# Patient Record
Sex: Female | Born: 1950 | Race: White | Hispanic: No | Marital: Married | Smoking: Never smoker
Health system: Southern US, Community
[De-identification: ages and names within clinical notes are randomized; demographics above are authoritative.]

## PROBLEM LIST (undated history)

## (undated) DIAGNOSIS — N3020 Other chronic cystitis without hematuria: Secondary | ICD-10-CM

## (undated) DIAGNOSIS — K219 Gastro-esophageal reflux disease without esophagitis: Secondary | ICD-10-CM

## (undated) DIAGNOSIS — M5430 Sciatica, unspecified side: Secondary | ICD-10-CM

## (undated) DIAGNOSIS — I340 Nonrheumatic mitral (valve) insufficiency: Secondary | ICD-10-CM

## (undated) DIAGNOSIS — M810 Age-related osteoporosis without current pathological fracture: Secondary | ICD-10-CM

## (undated) DIAGNOSIS — R519 Headache, unspecified: Secondary | ICD-10-CM

## (undated) DIAGNOSIS — R51 Headache: Secondary | ICD-10-CM

## (undated) DIAGNOSIS — I059 Rheumatic mitral valve disease, unspecified: Secondary | ICD-10-CM

## (undated) DIAGNOSIS — G43909 Migraine, unspecified, not intractable, without status migrainosus: Secondary | ICD-10-CM

## (undated) DIAGNOSIS — R7303 Prediabetes: Secondary | ICD-10-CM

## (undated) DIAGNOSIS — E782 Mixed hyperlipidemia: Secondary | ICD-10-CM

## (undated) HISTORY — PX: OOPHORECTOMY: SHX86

## (undated) HISTORY — PX: TONSILLECTOMY: SUR1361

## (undated) HISTORY — PX: COLONOSCOPY W/ POLYPECTOMY: SHX1380

## (undated) HISTORY — PX: ABDOMINAL HYSTERECTOMY: SHX81

## (undated) HISTORY — PX: INCONTINENCE SURGERY: SHX676

## (undated) HISTORY — PX: FL INJ RT SHOULDER CT ARTHROGRAM (ARMC HX): HXRAD1304

---

## 2004-08-21 HISTORY — PX: BREAST EXCISIONAL BIOPSY: SUR124

## 2005-12-07 ENCOUNTER — Ambulatory Visit: Payer: Self-pay | Admitting: Surgery

## 2006-04-01 ENCOUNTER — Ambulatory Visit: Payer: Self-pay | Admitting: Gastroenterology

## 2006-09-20 ENCOUNTER — Ambulatory Visit: Payer: Self-pay | Admitting: Urology

## 2006-10-01 ENCOUNTER — Ambulatory Visit: Payer: Self-pay | Admitting: Urology

## 2007-08-29 ENCOUNTER — Ambulatory Visit: Payer: Self-pay | Admitting: Gastroenterology

## 2008-01-14 ENCOUNTER — Ambulatory Visit: Payer: Self-pay | Admitting: Family Medicine

## 2010-09-04 ENCOUNTER — Ambulatory Visit: Payer: Self-pay | Admitting: Orthopedic Surgery

## 2010-10-13 ENCOUNTER — Other Ambulatory Visit: Payer: Self-pay | Admitting: Anesthesiology

## 2010-10-20 ENCOUNTER — Ambulatory Visit: Payer: Self-pay | Admitting: Orthopedic Surgery

## 2011-01-20 ENCOUNTER — Ambulatory Visit: Payer: Self-pay | Admitting: Internal Medicine

## 2011-05-28 ENCOUNTER — Ambulatory Visit: Payer: Self-pay

## 2013-05-30 ENCOUNTER — Ambulatory Visit: Payer: Self-pay

## 2014-04-05 ENCOUNTER — Ambulatory Visit: Payer: Self-pay | Admitting: Neurology

## 2014-06-12 ENCOUNTER — Ambulatory Visit: Payer: Self-pay

## 2015-02-05 ENCOUNTER — Emergency Department: Payer: Self-pay | Admitting: Emergency Medicine

## 2015-05-07 ENCOUNTER — Other Ambulatory Visit: Payer: Self-pay | Admitting: Physician Assistant

## 2015-05-07 DIAGNOSIS — Z1231 Encounter for screening mammogram for malignant neoplasm of breast: Secondary | ICD-10-CM

## 2015-05-21 ENCOUNTER — Other Ambulatory Visit: Payer: Self-pay | Admitting: Neurology

## 2015-05-21 DIAGNOSIS — R519 Headache, unspecified: Secondary | ICD-10-CM

## 2015-05-21 DIAGNOSIS — R55 Syncope and collapse: Secondary | ICD-10-CM

## 2015-05-21 DIAGNOSIS — R51 Headache: Principal | ICD-10-CM

## 2015-05-27 ENCOUNTER — Ambulatory Visit
Admission: RE | Admit: 2015-05-27 | Discharge: 2015-05-27 | Disposition: A | Discharge status: Home / Self Care | Payer: BLUE CROSS/BLUE SHIELD | Source: Ambulatory Visit | Attending: Neurology | Admitting: Neurology

## 2015-05-27 DIAGNOSIS — R51 Headache: Secondary | ICD-10-CM | POA: Insufficient documentation

## 2015-05-27 DIAGNOSIS — R55 Syncope and collapse: Secondary | ICD-10-CM | POA: Insufficient documentation

## 2015-05-27 DIAGNOSIS — G8929 Other chronic pain: Secondary | ICD-10-CM

## 2015-05-27 MED ORDER — GADOBENATE DIMEGLUMINE 529 MG/ML IV SOLN
15.0000 mL | Freq: Once | INTRAVENOUS | Status: AC | PRN
  Administered 2015-05-27: 14 mL via INTRAVENOUS

## 2015-06-18 ENCOUNTER — Ambulatory Visit: Payer: Self-pay

## 2015-06-19 ENCOUNTER — Ambulatory Visit
Admission: RE | Admit: 2015-06-19 | Discharge: 2015-06-19 | Disposition: A | Discharge status: Home / Self Care | Payer: BLUE CROSS/BLUE SHIELD | Source: Ambulatory Visit | Attending: Physician Assistant | Admitting: Physician Assistant

## 2015-06-19 DIAGNOSIS — Z1231 Encounter for screening mammogram for malignant neoplasm of breast: Secondary | ICD-10-CM | POA: Insufficient documentation

## 2016-08-13 ENCOUNTER — Other Ambulatory Visit: Payer: Self-pay | Admitting: Physician Assistant

## 2016-08-13 DIAGNOSIS — Z1231 Encounter for screening mammogram for malignant neoplasm of breast: Secondary | ICD-10-CM

## 2016-09-08 ENCOUNTER — Ambulatory Visit: Payer: BLUE CROSS/BLUE SHIELD

## 2016-09-24 ENCOUNTER — Ambulatory Visit
Admission: RE | Admit: 2016-09-24 | Discharge: 2016-09-24 | Disposition: A | Discharge status: Home / Self Care | Payer: BLUE CROSS/BLUE SHIELD | Source: Ambulatory Visit | Attending: Physician Assistant | Admitting: Physician Assistant

## 2016-09-24 DIAGNOSIS — Z1231 Encounter for screening mammogram for malignant neoplasm of breast: Secondary | ICD-10-CM

## 2016-10-08 ENCOUNTER — Other Ambulatory Visit: Payer: Self-pay | Admitting: Gastroenterology

## 2016-10-08 DIAGNOSIS — R1310 Dysphagia, unspecified: Secondary | ICD-10-CM

## 2016-10-08 DIAGNOSIS — K219 Gastro-esophageal reflux disease without esophagitis: Secondary | ICD-10-CM

## 2016-11-09 ENCOUNTER — Ambulatory Visit: Payer: BLUE CROSS/BLUE SHIELD | Attending: Gastroenterology

## 2017-01-20 ENCOUNTER — Ambulatory Visit
Admission: RE | Admit: 2017-01-20 | Discharge: 2017-01-20 | Disposition: A | Discharge status: Home / Self Care | Payer: BLUE CROSS/BLUE SHIELD | Source: Ambulatory Visit | Attending: Gastroenterology | Admitting: Gastroenterology

## 2017-01-20 DIAGNOSIS — K449 Diaphragmatic hernia without obstruction or gangrene: Secondary | ICD-10-CM | POA: Insufficient documentation

## 2017-01-20 DIAGNOSIS — K228 Other specified diseases of esophagus: Secondary | ICD-10-CM | POA: Diagnosis not present

## 2017-01-20 DIAGNOSIS — K219 Gastro-esophageal reflux disease without esophagitis: Secondary | ICD-10-CM

## 2017-01-20 DIAGNOSIS — R1310 Dysphagia, unspecified: Secondary | ICD-10-CM

## 2017-01-25 ENCOUNTER — Encounter: Payer: Self-pay | Admitting: *Deleted

## 2017-01-26 ENCOUNTER — Ambulatory Visit: Payer: BLUE CROSS/BLUE SHIELD | Admitting: Anesthesiology

## 2017-01-26 ENCOUNTER — Ambulatory Visit
Admission: RE | Admit: 2017-01-26 | Discharge: 2017-01-26 | Disposition: A | Discharge status: Home / Self Care | Payer: BLUE CROSS/BLUE SHIELD | Source: Ambulatory Visit | Attending: Gastroenterology | Admitting: Gastroenterology

## 2017-01-26 ENCOUNTER — Encounter
Admission: RE | Disposition: A | Discharge status: Home / Self Care | Payer: Self-pay | Source: Ambulatory Visit | Attending: Gastroenterology

## 2017-01-26 DIAGNOSIS — K210 Gastro-esophageal reflux disease with esophagitis: Secondary | ICD-10-CM | POA: Insufficient documentation

## 2017-01-26 DIAGNOSIS — K228 Other specified diseases of esophagus: Secondary | ICD-10-CM | POA: Diagnosis not present

## 2017-01-26 DIAGNOSIS — Z79899 Other long term (current) drug therapy: Secondary | ICD-10-CM | POA: Insufficient documentation

## 2017-01-26 DIAGNOSIS — R51 Headache: Secondary | ICD-10-CM | POA: Diagnosis not present

## 2017-01-26 DIAGNOSIS — K224 Dyskinesia of esophagus: Secondary | ICD-10-CM | POA: Diagnosis not present

## 2017-01-26 DIAGNOSIS — B3781 Candidal esophagitis: Secondary | ICD-10-CM | POA: Insufficient documentation

## 2017-01-26 DIAGNOSIS — Z7983 Long term (current) use of bisphosphonates: Secondary | ICD-10-CM | POA: Insufficient documentation

## 2017-01-26 DIAGNOSIS — Z79890 Hormone replacement therapy (postmenopausal): Secondary | ICD-10-CM | POA: Diagnosis not present

## 2017-01-26 DIAGNOSIS — R1310 Dysphagia, unspecified: Secondary | ICD-10-CM | POA: Diagnosis not present

## 2017-01-26 DIAGNOSIS — Z792 Long term (current) use of antibiotics: Secondary | ICD-10-CM | POA: Insufficient documentation

## 2017-01-26 DIAGNOSIS — Z881 Allergy status to other antibiotic agents status: Secondary | ICD-10-CM | POA: Diagnosis not present

## 2017-01-26 DIAGNOSIS — Z791 Long term (current) use of non-steroidal anti-inflammatories (NSAID): Secondary | ICD-10-CM | POA: Diagnosis not present

## 2017-01-26 DIAGNOSIS — Q399 Congenital malformation of esophagus, unspecified: Secondary | ICD-10-CM | POA: Diagnosis not present

## 2017-01-26 DIAGNOSIS — K449 Diaphragmatic hernia without obstruction or gangrene: Secondary | ICD-10-CM | POA: Insufficient documentation

## 2017-01-26 DIAGNOSIS — Z888 Allergy status to other drugs, medicaments and biological substances status: Secondary | ICD-10-CM | POA: Diagnosis not present

## 2017-01-26 DIAGNOSIS — Z882 Allergy status to sulfonamides status: Secondary | ICD-10-CM | POA: Insufficient documentation

## 2017-01-26 HISTORY — DX: Rheumatic mitral valve disease, unspecified: I05.9

## 2017-01-26 HISTORY — DX: Other chronic cystitis without hematuria: N30.20

## 2017-01-26 HISTORY — PX: ESOPHAGOGASTRODUODENOSCOPY (EGD) WITH PROPOFOL: SHX5813

## 2017-01-26 HISTORY — DX: Gastro-esophageal reflux disease without esophagitis: K21.9

## 2017-01-26 HISTORY — DX: Headache, unspecified: R51.9

## 2017-01-26 HISTORY — DX: Age-related osteoporosis without current pathological fracture: M81.0

## 2017-01-26 HISTORY — DX: Headache: R51

## 2017-01-26 SURGERY — ESOPHAGOGASTRODUODENOSCOPY (EGD) WITH PROPOFOL
Anesthesia: General

## 2017-01-26 MED ORDER — PHENYLEPHRINE HCL 10 MG/ML IJ SOLN
INTRAMUSCULAR | Status: DC | PRN
  Administered 2017-01-26 (×2): 100 ug via INTRAVENOUS

## 2017-01-26 MED ORDER — SODIUM CHLORIDE 0.9 % IV SOLN: INTRAVENOUS | Status: DC

## 2017-01-26 MED ORDER — PROPOFOL 500 MG/50ML IV EMUL
INTRAVENOUS | Status: AC
  Filled 2017-01-26: qty 50

## 2017-01-26 MED ORDER — FENTANYL CITRATE (PF) 100 MCG/2ML IJ SOLN
INTRAMUSCULAR | Status: AC
  Filled 2017-01-26: qty 2

## 2017-01-26 MED ORDER — PROPOFOL 500 MG/50ML IV EMUL
INTRAVENOUS | Status: DC | PRN
  Administered 2017-01-26: 140 ug/kg/min via INTRAVENOUS

## 2017-01-26 MED ORDER — FENTANYL CITRATE (PF) 100 MCG/2ML IJ SOLN
INTRAMUSCULAR | Status: DC | PRN
  Administered 2017-01-26: 50 ug via INTRAVENOUS

## 2017-01-26 MED ORDER — PROPOFOL 10 MG/ML IV BOLUS
INTRAVENOUS | Status: DC | PRN
  Administered 2017-01-26: 100 mg via INTRAVENOUS

## 2017-01-26 MED ORDER — LIDOCAINE 2% (20 MG/ML) 5 ML SYRINGE
INTRAMUSCULAR | Status: DC | PRN
  Administered 2017-01-26: 40 mg via INTRAVENOUS

## 2017-01-26 MED ORDER — SODIUM CHLORIDE 0.9 % IV SOLN
INTRAVENOUS | Status: DC
  Administered 2017-01-26: 11:00:00 via INTRAVENOUS

## 2017-01-26 NOTE — Anesthesia Preprocedure Evaluation (Signed)
Anesthesia Evaluation  Patient identified by MRN, date of birth, ID band Patient awake    Reviewed: Allergy & Precautions, H&P , NPO status , Patient's Chart, lab work & pertinent test results, reviewed documented beta blocker date and time   Airway Mallampati: II   Neck ROM: full    Dental  (+) Teeth Intact   Pulmonary neg pulmonary ROS,    Pulmonary exam normal        Cardiovascular negative cardio ROS Normal cardiovascular exam Rhythm:regular Rate:Normal     Neuro/Psych  Headaches, negative neurological ROS  negative psych ROS   GI/Hepatic negative GI ROS, Neg liver ROS, GERD  Medicated,  Endo/Other  negative endocrine ROS  Renal/GU negative Renal ROS  negative genitourinary   Musculoskeletal   Abdominal   Peds  Hematology negative hematology ROS (+)   Anesthesia Other Findings Past Medical History: No date: Bladder infection, chronic No date: GERD (gastroesophageal reflux disease) No date: Headache No date: Mitral valve disorder No date: Senile osteoporosis Past Surgical History: No date: ABDOMINAL HYSTERECTOMY 08/21/04: BREAST EXCISIONAL BIOPSY Left     Comment: neg No date: INCONTINENCE SURGERY BMI    Body Mass Index:  26.13 kg/m     Reproductive/Obstetrics negative OB ROS                             Anesthesia Physical Anesthesia Plan  ASA: II  Anesthesia Plan: General   Post-op Pain Management:    Induction:   Airway Management Planned:   Additional Equipment:   Intra-op Plan:   Post-operative Plan:   Informed Consent: I have reviewed the patients History and Physical, chart, labs and discussed the procedure including the risks, benefits and alternatives for the proposed anesthesia with the patient or authorized representative who has indicated his/her understanding and acceptance.   Dental Advisory Given  Plan Discussed with: CRNA  Anesthesia Plan  Comments:         Anesthesia Quick Evaluation

## 2017-01-26 NOTE — Anesthesia Post-op Follow-up Note (Cosign Needed)
Anesthesia QCDR form completed.        

## 2017-01-26 NOTE — Op Note (Signed)
[facility] Gastroenterology Patient Name: Brittney Campos Procedure Date: 01/26/2017 12:02 PM MRN: [id number] Account #: [id number] Date of Birth: [birth year] Admit Type: Outpatient Age: 66 Room: [facility] ENDO ROOM 3 Gender: Female Note Status: Finalized Procedure:            Upper GI endoscopy Indications:          Dysphagia, Gastro-esophageal reflux disease Providers:            Lollie Sails, MD Referring MD:         Precious Bard, MD (Referring MD) Medicines:            Monitored Anesthesia Care Complications:        No immediate complications. Procedure:            Pre-Anesthesia Assessment:                       - ASA Grade Assessment: II - A patient with mild                        systemic disease.                       After obtaining informed consent, the endoscope was                        passed under direct vision. Throughout the procedure,                        the patient's blood pressure, pulse, and oxygen                        saturations were monitored continuously. The Endoscope                        was introduced through the mouth, and advanced to the                        third part of duodenum. The upper GI endoscopy was                        accomplished without difficulty. The patient tolerated                        the procedure well. Findings:      The lower third of the esophagus was moderately tortuous.      Abnormal motility was noted in the middle third of the esophagus and in       the lower third of the esophagus. The cricopharyngeus was normal. There       is spasticity and extra peristaltic waves of the esophageal body. The       distal esophagus/lower esophageal sphincter is open. Tertiary       peristaltic waves are noted.      The Z-line was variable. Biopsies were taken with a cold forceps for       histology.      A small hiatal hernia was present. This gives a 'shelf-like" protrusion       into the lumen  with a sharp turn to enter the gastric vault. There is no       evidence of fixed stricture.  The entire examined stomach was normal.      The examined duodenum was normal.      Patchy candidiasis was found in the middle third of the esophagus and in       the lower third of the esophagus. Cells for cytology were obtained by       brushing. Impression:           - Tortuous esophagus.                       - Abnormal esophageal motility, consistent with                        presbyesophagus.                       - Z-line variable. Biopsied.                       - Small hiatal hernia.                       - Normal stomach.                       - Normal examined duodenum.                       - Monilial esophagitis. Cells for cytology obtained. Recommendation:       - Discharge patient to home.                       - Continue present medications.                       - consider Mycelex (clotrimazole) 10 mg lozenge 5x/day                        for 5 days after cytology results. . Procedure Code(s):    --- Professional ---                       [phone], Esophagogastroduodenoscopy, flexible, transoral;                        with biopsy, single or multiple Diagnosis Code(s):    --- Professional ---                       Q39.9, Congenital malformation of esophagus, unspecified                       K22.4, Dyskinesia of esophagus                       K22.8, Other specified diseases of esophagus                       K44.9, Diaphragmatic hernia without obstruction or                        gangrene                       B37.81, Candidal esophagitis  R13.10, Dysphagia, unspecified                       K21.9, Gastro-esophageal reflux disease without                        esophagitis CPT copyright 2016 American Medical Association. All rights reserved. The codes documented in this report are preliminary and upon coder review may  be revised to meet current  compliance requirements. Lollie Sails, MD 01/26/2017 12:50:12 PM This report has been signed electronically. Number of Addenda: 0 Note Initiated On: 01/26/2017 12:02 PM      [facility]

## 2017-01-26 NOTE — Transfer of Care (Signed)
Immediate Anesthesia Transfer of Care Note  Patient: Brittney Campos  Procedure(s) Performed: Procedure(s): ESOPHAGOGASTRODUODENOSCOPY (EGD) WITH PROPOFOL (N/A)  Patient Location: PACU  Anesthesia Type:General  Level of Consciousness: awake, alert  and oriented  Airway & Oxygen Therapy: Patient Spontanous Breathing and Patient connected to nasal cannula oxygen  Post-op Assessment: Report given to RN and Post -op Vital signs reviewed and stable  Post vital signs: Reviewed and stable  Last Vitals:  Vitals:   01/26/17 1021 01/26/17 1255  BP: 122/67 (!) 96/47  Pulse: 76 68  Resp: 14 18  Temp: (!) 35.7 C (!) 36.1 C    Last Pain:  Vitals:   01/26/17 1255  TempSrc: Tympanic         Complications: No apparent anesthesia complications

## 2017-01-26 NOTE — H&P (Signed)
Outpatient short stay form Pre-procedure 01/26/2017 12:15 PM  Lollie Sails MD  Primary Physician: Krystal Clark PA  Reason for visit:  EGD  History of present illness:  Patient is a 66 year old female presenting today as above. She has a history of dysphagia in the past. She has a recent barium esophagram showing presbyesophagus. There was no fixed stricture nor was there any evidence of hang-up of the barium tablet. This promptly past in the mouth into the stomach. Patient does not regurgitate foods. She feels like things go down slowly at times. This includes both liquids and solids. He does take a proton pump inhibitor.    Current Facility-Administered Medications:  .  0.9 %  sodium chloride infusion, , Intravenous, Continuous, Lollie Sails, MD, Last Rate: 20 mL/hr at 01/26/17 1042 .  0.9 %  sodium chloride infusion, , Intravenous, Continuous, Lollie Sails, MD .  0.9 %  sodium chloride infusion, , Intravenous, Continuous, Lollie Sails, MD  Prescriptions Prior to Admission  Medication Sig Dispense Refill Last Dose  . ALPRAZolam (XANAX) 0.25 MG tablet Take 0.25 mg by mouth 3 (three) times daily as needed for anxiety.   Past Week at Unknown time  . calcium carbonate (OSCAL) 1500 (600 Ca) MG TABS tablet Take by mouth daily.   01/23/2017  . cyclobenzaprine (FLEXERIL) 5 MG tablet Take 5 mg by mouth 2 (two) times daily as needed for muscle spasms.   01/24/2017  . estradiol (ESTRACE) 0.1 MG/GM vaginal cream Place 1 Applicatorful vaginally once a week.   01/25/2017 at Unknown time  . ibandronate (BONIVA) 150 MG tablet Take 150 mg by mouth every 30 (thirty) days. Take in the morning with a full glass of water, on an empty stomach, and do not take anything else by mouth or lie down for the next 30 min.   Past Month at Unknown time  . Multiple Vitamin (MULTIVITAMIN) tablet Take 1 tablet by mouth daily.   01/23/2017  . pantoprazole (PROTONIX) 40 MG tablet Take 40 mg by mouth daily.    01/25/2017 at Unknown time  . promethazine (PHENERGAN) 25 MG tablet Take 25 mg by mouth daily as needed for nausea or vomiting.   01/17/2017  . ranitidine (ZANTAC) 300 MG capsule Take 300 mg by mouth every evening.   01/25/2017 at Unknown time  . SUMAtriptan (IMITREX) 100 MG tablet Take 100 mg by mouth every 2 (two) hours as needed for migraine. May repeat in 2 hours if headache persists or recurs.   01/24/2017  . topiramate (TOPAMAX) 100 MG tablet Take 100 mg by mouth daily.   01/25/2017 at Unknown time  . meloxicam (MOBIC) 7.5 MG tablet Take 7.5 mg by mouth daily.   Not Taking at Unknown time  . nitrofurantoin (MACRODANTIN) 50 MG capsule Take 50 mg by mouth daily.   Not Taking at Unknown time     Allergies  Allergen Reactions  . Biaxin [Clarithromycin]   . Fosamax [Alendronate Sodium]   . Macrobid [Nitrofurantoin Macrocrystal]   . Prednisone   . Sulfamethoxazole-Trimethoprim   . Venlafaxine      Past Medical History:  Diagnosis Date  . Bladder infection, chronic   . GERD (gastroesophageal reflux disease)   . Headache   . Mitral valve disorder   . Senile osteoporosis     Review of systems:      Physical Exam    Heart and lungs: Regular rate and rhythm without rub or gallop, lungs are bilaterally clear.  HEENT: Normal cephalic atraumatic eyes are anicteric    Other:     Pertinant exam for procedure: Soft nontender nondistended bowel sounds positive normoacti    Planned proceedures: EGD and indicated procedures. I have discussed the risks benefits and complications of procedures to include not limited to bleeding, infection, perforation and the risk of sedation and the patient wishes to proceed.    Lollie Sails, MD Gastroenterology 01/26/2017  12:15 PM

## 2017-01-27 ENCOUNTER — Encounter: Payer: Self-pay | Admitting: Gastroenterology

## 2017-01-27 LAB — SURGICAL PATHOLOGY

## 2017-01-27 NOTE — Anesthesia Postprocedure Evaluation (Signed)
Anesthesia Post Note  Patient: Brittney Campos  Procedure(s) Performed: Procedure(s) (LRB): ESOPHAGOGASTRODUODENOSCOPY (EGD) WITH PROPOFOL (N/A)  Patient location during evaluation: PACU Anesthesia Type: General Level of consciousness: awake and alert Pain management: pain level controlled Vital Signs Assessment: post-procedure vital signs reviewed and stable Respiratory status: spontaneous breathing, nonlabored ventilation, respiratory function stable and patient connected to nasal cannula oxygen Cardiovascular status: blood pressure returned to baseline and stable Postop Assessment: no signs of nausea or vomiting Anesthetic complications: no     Last Vitals:  Vitals:   01/26/17 1021 01/26/17 1255  BP: 122/67 (!) 96/47  Pulse: 76 68  Resp: 14 18  Temp: (!) 35.7 C (!) 36.1 C    Last Pain:  Vitals:   01/26/17 1255  TempSrc: Tympanic                 [name]

## 2017-02-02 LAB — KOH PREP: KOH Prep: NONE SEEN

## 2017-06-03 ENCOUNTER — Ambulatory Visit
Admission: EM | Admit: 2017-06-03 | Discharge: 2017-06-03 | Disposition: A | Discharge status: Home / Self Care | Payer: BLUE CROSS/BLUE SHIELD | Attending: Family Medicine | Admitting: Family Medicine

## 2017-06-03 ENCOUNTER — Encounter: Payer: Self-pay | Admitting: *Deleted

## 2017-06-03 DIAGNOSIS — S41111A Laceration without foreign body of right upper arm, initial encounter: Secondary | ICD-10-CM

## 2017-06-03 DIAGNOSIS — W548XXA Other contact with dog, initial encounter: Secondary | ICD-10-CM | POA: Diagnosis not present

## 2017-06-03 MED ORDER — TETANUS-DIPHTH-ACELL PERTUSSIS 5-2.5-18.5 LF-MCG/0.5 IM SUSP
0.5000 mL | Freq: Once | INTRAMUSCULAR | Status: AC
  Administered 2017-06-03: 0.5 mL via INTRAMUSCULAR

## 2017-06-03 NOTE — ED Provider Notes (Signed)
[facility]    CSN: [id number] Arrival date & time: 06/03/17  1745     History   Chief Complaint Chief Complaint  Patient presents with  . Laceration    HPI Brittney Campos is a 66 y.o. female.   66 yo female with a c/o skin tear to right arm after being scratched by a dog 6 days ago. States the dog scratched her with his paw, tearing some of the skin. Denies any drainage, fevers, chills. Has been cleaning wound with hydrogen peroxide. Needs tetanus vaccine.    The history is provided by the patient.  Laceration    Past Medical History:  Diagnosis Date  . Bladder infection, chronic   . GERD (gastroesophageal reflux disease)   . Headache   . Mitral valve disorder   . Senile osteoporosis     There are no active problems to display for this patient.   Past Surgical History:  Procedure Laterality Date  . ABDOMINAL HYSTERECTOMY    . BREAST EXCISIONAL BIOPSY Left 08/21/04   neg  . ESOPHAGOGASTRODUODENOSCOPY (EGD) WITH PROPOFOL N/A 01/26/2017   Procedure: ESOPHAGOGASTRODUODENOSCOPY (EGD) WITH PROPOFOL;  Surgeon: Lollie Sails, MD;  Location: [facility] ENDOSCOPY;  Service: Endoscopy;  Laterality: N/A;  . INCONTINENCE SURGERY      OB History    No data available       Home Medications    Prior to Admission medications   Medication Sig Start Date End Date Taking? Authorizing Provider  ALPRAZolam (XANAX) 0.25 MG tablet Take 0.25 mg by mouth 3 (three) times daily as needed for anxiety.   Yes [provider]  calcium carbonate (OSCAL) 1500 (600 Ca) MG TABS tablet Take by mouth daily.   Yes [provider]  cyclobenzaprine (FLEXERIL) 5 MG tablet Take 5 mg by mouth 2 (two) times daily as needed for muscle spasms.   Yes [provider]  Estradiol (VAGIFEM) 10 MCG TABS vaginal tablet Place vaginally.   Yes [provider]  ibandronate (BONIVA) 150 MG tablet Take 150 mg by mouth every 30 (thirty) days. Take in the morning  with a full glass of water, on an empty stomach, and do not take anything else by mouth or lie down for the next 30 min.   Yes [provider]  Multiple Vitamin (MULTIVITAMIN) tablet Take 1 tablet by mouth daily.   Yes [provider]  nitrofurantoin (MACRODANTIN) 50 MG capsule Take 50 mg by mouth daily.   Yes [provider]  pantoprazole (PROTONIX) 40 MG tablet Take 40 mg by mouth daily.   Yes [provider]  promethazine (PHENERGAN) 25 MG tablet Take 25 mg by mouth daily as needed for nausea or vomiting.   Yes [provider]  ranitidine (ZANTAC) 300 MG capsule Take 300 mg by mouth every evening.   Yes [provider]  SUMAtriptan (IMITREX) 100 MG tablet Take 100 mg by mouth every 2 (two) hours as needed for migraine. May repeat in 2 hours if headache persists or recurs.   Yes [provider]  topiramate (TOPAMAX) 100 MG tablet Take 100 mg by mouth daily.   Yes [provider]  estradiol (ESTRACE) 0.1 MG/GM vaginal cream Place 1 Applicatorful vaginally once a week.    [provider]  meloxicam (MOBIC) 7.5 MG tablet Take 7.5 mg by mouth daily.    [provider]    Family History Family History  Problem Relation Age of Onset  . Breast cancer Neg  Hx     Social History Social History  Substance Use Topics  . Smoking status: Never Smoker  . Smokeless tobacco: Never Used  . Alcohol use No     Allergies   Biaxin [clarithromycin]; Fosamax [alendronate sodium]; Macrobid [nitrofurantoin macrocrystal]; Prednisone; Sulfamethoxazole-trimethoprim; and Venlafaxine   Review of Systems Review of Systems   Physical Exam Triage Vital Signs ED Triage Vitals  Enc Vitals Group     BP 06/03/17 1835 127/79     Pulse Rate 06/03/17 1835 65     Resp 06/03/17 1835 16     Temp 06/03/17 1835 (!) 97.4 F (36.3 C)     Temp Source 06/03/17 1835 Oral     SpO2 06/03/17 1835 100 %     Weight 06/03/17 1837 150 lb  (68 kg)     Height 06/03/17 1837 5\' 5"  (1.651 m)     Head Circumference --      Peak Flow --      Pain Score 06/03/17 1838 4     Pain Loc --      Pain Edu? --      Excl. in [place]? --    No data found.   Updated Vital Signs BP 127/79 (BP Location: Left Arm)   Pulse 65   Temp (!) 97.4 F (36.3 C) (Oral)   Resp 16   Ht 5\' 5"  (1.651 m)   Wt 150 lb (68 kg)   SpO2 100%   BMI 24.96 kg/m   Visual Acuity Right Eye Distance:   Left Eye Distance:   Bilateral Distance:    Right Eye Near:   Left Eye Near:    Bilateral Near:     Physical Exam  Constitutional: She appears well-developed and well-nourished. No distress.  Skin: She is not diaphoretic.  3x4cm skin tear/abrasion to right arm; no drainage; no erythema  Nursing note and vitals reviewed.    UC Treatments / Results  Labs (all labs ordered are listed, but only abnormal results are displayed) Labs Reviewed - No data to display  EKG  EKG Interpretation None       Radiology No results found.  Procedures Procedures (including critical care time)  Medications Ordered in UC Medications  Tdap (BOOSTRIX) injection 0.5 mL (0.5 mLs Intramuscular Given 06/03/17 1907)     Initial Impression / Assessment and Plan / UC Course  I have reviewed the triage vital signs and the nursing notes.  Pertinent labs & imaging results that were available during my care of the patient were reviewed by me and considered in my medical decision making (see chart for details).       Final Clinical Impressions(s) / UC Diagnoses   Final diagnoses:  Skin tear of right upper arm without complication, initial encounter    New Prescriptions Discharge Medication List as of 06/03/2017  7:24 PM     1. diagnosis reviewed with patient 2. Recommend supportive treatment with routine wound care 3. Tetanus vaccine given 4. Follow-up prn if symptoms worsen or don't improve   Norval Gable, MD 06/03/17 2024

## 2017-06-03 NOTE — ED Triage Notes (Signed)
Patient's dog scratched the patient's right forearm creating a skin tear 6 days ago. Patient has applied peroxide repeatedly to the abrasion.

## 2017-06-03 NOTE — Discharge Instructions (Signed)
Triple antibiotic ointment or Bacitracin to wound area twice daily

## 2017-08-18 ENCOUNTER — Other Ambulatory Visit: Payer: Self-pay | Admitting: Physician Assistant

## 2017-08-18 DIAGNOSIS — K219 Gastro-esophageal reflux disease without esophagitis: Secondary | ICD-10-CM | POA: Diagnosis not present

## 2017-08-18 DIAGNOSIS — R413 Other amnesia: Secondary | ICD-10-CM | POA: Diagnosis not present

## 2017-08-18 DIAGNOSIS — Z0000 Encounter for general adult medical examination without abnormal findings: Secondary | ICD-10-CM | POA: Diagnosis not present

## 2017-08-18 DIAGNOSIS — M810 Age-related osteoporosis without current pathological fracture: Secondary | ICD-10-CM | POA: Diagnosis not present

## 2017-08-18 DIAGNOSIS — G43019 Migraine without aura, intractable, without status migrainosus: Secondary | ICD-10-CM | POA: Diagnosis not present

## 2017-08-18 DIAGNOSIS — Z1231 Encounter for screening mammogram for malignant neoplasm of breast: Secondary | ICD-10-CM | POA: Diagnosis not present

## 2017-10-19 DIAGNOSIS — R4789 Other speech disturbances: Secondary | ICD-10-CM | POA: Diagnosis not present

## 2017-10-19 DIAGNOSIS — G43019 Migraine without aura, intractable, without status migrainosus: Secondary | ICD-10-CM | POA: Diagnosis not present

## 2017-11-29 ENCOUNTER — Other Ambulatory Visit: Payer: Self-pay

## 2017-11-29 ENCOUNTER — Encounter: Payer: Self-pay | Admitting: Emergency Medicine

## 2017-11-29 ENCOUNTER — Ambulatory Visit
Admission: EM | Admit: 2017-11-29 | Discharge: 2017-11-29 | Disposition: A | Discharge status: Home / Self Care | Payer: PPO | Attending: Emergency Medicine | Admitting: Emergency Medicine

## 2017-11-29 DIAGNOSIS — J029 Acute pharyngitis, unspecified: Secondary | ICD-10-CM

## 2017-11-29 DIAGNOSIS — J069 Acute upper respiratory infection, unspecified: Secondary | ICD-10-CM

## 2017-11-29 LAB — RAPID STREP SCREEN (MED CTR MEBANE ONLY): STREPTOCOCCUS, GROUP A SCREEN (DIRECT): NEGATIVE

## 2017-11-29 MED ORDER — FLUTICASONE PROPIONATE 50 MCG/ACT NA SUSP: 2.0000 | Freq: Every day | NASAL | 0 refills | Status: DC

## 2017-11-29 MED ORDER — IBUPROFEN 600 MG PO TABS: 600.0000 mg | ORAL_TABLET | Freq: Four times a day (QID) | ORAL | 0 refills | Status: DC | PRN

## 2017-11-29 NOTE — Discharge Instructions (Signed)
your rapid strep was negative today, so we have sent off a throat culture.  We will contact you and call in the appropriate antibiotics if your culture comes back positive for an infection requiring antibiotic treatment.  Give [place] a working phone number.  If you were given a prescription for antibiotics, you may want to wait and fill it until you know the results of the culture.  1 gram of Tylenol and 600 mg ibuprofen together 3-4 times a day as needed for pain.  Make sure you drink plenty of extra fluids.  Some people find salt water gargles and  Traditional Medicinal's "Throat Coat" tea helpful. Take 5 mL of liquid Benadryl and 5 mL of Maalox. Mix it together, and then hold it in your mouth for as long as you can and then swallow. You may do this 4 times a day.    Go to www.goodrx.com to look up your medications. This will give you a list of where you can find your prescriptions at the most affordable prices. Or ask the pharmacist what the cash price is, or if they have any other discount programs available to help make your medication more affordable. This can be less expensive than what you would pay with insurance.

## 2017-11-29 NOTE — ED Triage Notes (Signed)
Patient c/o sore throat, runny nose and congestion that started Saturday.  Patient denies fevers.

## 2017-11-29 NOTE — ED Provider Notes (Signed)
HPI  SUBJECTIVE:  Brittney Campos is a 66 y.o. female who presents with sore throat, nausea, diffuse achy headache and malaise for the past 2-3 days.  She states that she feels as if she "has a glob in her throat".  She reports postnasal drip and a mild cough.  States her granddaughter had strep last week.  She tried Mucinex and ibuprofen 400 mg.  She took the ibuprofen within 6-8 hours of evaluation.  No aggravating or alleviating factors.  No voice changes, drooling, trismus, sensation of throat swelling shut, difficulty breathing.  No nasal congestion, rhinorrhea, sinus pain or pressure, upper dental pain.  No abdominal pain, rash.  No body aches.  No allergy or GERD symptoms.  She has a past medical history of recurrent strep and is status post tonsillectomy.  Also migraines, GERD, osteoporosis and hiatal hernia.  No history of diabetes, hypertension, kidney disease, GI bleed.  PMD: Marinda Elk, MD   Past Medical History:  Diagnosis Date  . Bladder infection, chronic   . GERD (gastroesophageal reflux disease)   . Headache   . Mitral valve disorder   . Senile osteoporosis     Past Surgical History:  Procedure Laterality Date  . ABDOMINAL HYSTERECTOMY    . BREAST EXCISIONAL BIOPSY Left 08/21/04   neg  . ESOPHAGOGASTRODUODENOSCOPY (EGD) WITH PROPOFOL N/A 01/26/2017   Procedure: ESOPHAGOGASTRODUODENOSCOPY (EGD) WITH PROPOFOL;  Surgeon: Lollie Sails, MD;  Location: [facility] ENDOSCOPY;  Service: Endoscopy;  Laterality: N/A;  . INCONTINENCE SURGERY      Family History  Problem Relation Age of Onset  . Breast cancer Neg Hx     Social History   Tobacco Use  . Smoking status: Never Smoker  . Smokeless tobacco: Never Used  Substance Use Topics  . Alcohol use: No  . Drug use: No    No current facility-administered medications for this encounter.   Current Outpatient Medications:  .  ALPRAZolam (XANAX) 0.25 MG tablet, Take 0.25 mg by mouth 3 (three) times daily as  needed for anxiety., Disp: , Rfl:  .  calcium carbonate (OSCAL) 1500 (600 Ca) MG TABS tablet, Take by mouth daily., Disp: , Rfl:  .  cyclobenzaprine (FLEXERIL) 5 MG tablet, Take 5 mg by mouth 2 (two) times daily as needed for muscle spasms., Disp: , Rfl:  .  estradiol (ESTRACE) 0.1 MG/GM vaginal cream, Place 1 Applicatorful vaginally once a week., Disp: , Rfl:  .  ibandronate (BONIVA) 150 MG tablet, Take 150 mg by mouth every 30 (thirty) days. Take in the morning with a full glass of water, on an empty stomach, and do not take anything else by mouth or lie down for the next 30 min., Disp: , Rfl:  .  Multiple Vitamin (MULTIVITAMIN) tablet, Take 1 tablet by mouth daily., Disp: , Rfl:  .  pantoprazole (PROTONIX) 40 MG tablet, Take 40 mg by mouth daily., Disp: , Rfl:  .  promethazine (PHENERGAN) 25 MG tablet, Take 25 mg by mouth daily as needed for nausea or vomiting., Disp: , Rfl:  .  ranitidine (ZANTAC) 300 MG capsule, Take 300 mg by mouth every evening., Disp: , Rfl:  .  SUMAtriptan (IMITREX) 100 MG tablet, Take 100 mg by mouth every 2 (two) hours as needed for migraine. May repeat in 2 hours if headache persists or recurs., Disp: , Rfl:  .  topiramate (TOPAMAX) 100 MG tablet, Take 100 mg by mouth daily., Disp: , Rfl:  .  Estradiol (VAGIFEM) 10 MCG  TABS vaginal tablet, Place vaginally., Disp: , Rfl:  .  fluticasone (FLONASE) 50 MCG/ACT nasal spray, Place 2 sprays into both nostrils daily., Disp: 16 g, Rfl: 0 .  ibuprofen (ADVIL,MOTRIN) 600 MG tablet, Take 1 tablet (600 mg total) by mouth every 6 (six) hours as needed., Disp: 30 tablet, Rfl: 0  Allergies  Allergen Reactions  . Biaxin [Clarithromycin]   . Fosamax [Alendronate Sodium]   . Macrobid [Nitrofurantoin Macrocrystal]   . Prednisone   . Sulfamethoxazole-Trimethoprim   . Venlafaxine      ROS  As noted in HPI.   Physical Exam  BP 130/74 (BP Location: Left Arm)   Pulse 66   Temp 97.8 F (36.6 C) (Oral)   Resp 16   Ht 5\' 5"   (1.651 m)   Wt 158 lb (71.7 kg)   SpO2 100%   BMI 26.29 kg/m   Constitutional: Well developed, well nourished, no acute distress Eyes:  EOMI, conjunctiva normal bilaterally HENT: Normocephalic, atraumatic,mucus membranes moist.  Minimal nasal congestion.  Normal turbinates.  No sinus tenderness.  Tonsils surgically absent.  Oropharynx slightly erythematous.  Positive postnasal drip with cobblestoning.  Uvula midline, not swollen. Neck: No cervical lymphadenopathy Respiratory: Normal inspiratory effort good air movement, lungs clear bilaterally  cardiovascular: Normal rate regular rhythm, no murmurs, rubs, gallops GI: nondistended no splenomegaly. skin: No rash, skin intact Musculoskeletal: no deformities Neurologic: Alert & oriented x 3, no focal neuro deficits Psychiatric: Speech and behavior appropriate   ED Course   Medications - No data to display  Orders Placed This Encounter  Procedures  . Rapid strep screen    Standing Status:   Standing    Number of Occurrences:   1    Order Specific Question:   Patient immune status    Answer:   Normal  . Culture, group A strep    Standing Status:   Standing    Number of Occurrences:   1    Results for orders placed or performed during the hospital encounter of 11/29/17 (from the past 24 hour(s))  Rapid strep screen     Status: None   Collection Time: 11/29/17  2:05 PM  Result Value Ref Range   Streptococcus, Group A Screen (Direct) NEGATIVE NEGATIVE   No results found.  ED Clinical Impression  Pharyngitis, unspecified etiology  Upper respiratory tract infection, unspecified type   ED Assessment/Plan  Rapid strep negative.  Presentation consistent with viral pharyngitis/URI.  Home with continued Mucinex D, Flonase, ibuprofen 600 mg to take with 1 g of Tylenol 3-4 times a day as needed for pain, Benadryl/Maalox mixture 3-4 times a day as needed for sore throat, follow-up with PMD as needed.    Meds ordered this  encounter  Medications  . ibuprofen (ADVIL,MOTRIN) 600 MG tablet    Sig: Take 1 tablet (600 mg total) by mouth every 6 (six) hours as needed.    Dispense:  30 tablet    Refill:  0  . fluticasone (FLONASE) 50 MCG/ACT nasal spray    Sig: Place 2 sprays into both nostrils daily.    Dispense:  16 g    Refill:  0    *This clinic note was created using [employer]. Therefore, there may be occasional mistakes despite careful proofreading.   ?   Melynda Ripple, MD 11/29/17 2014

## 2017-12-02 LAB — CULTURE, GROUP A STREP (THRC)

## 2018-02-17 DIAGNOSIS — R4789 Other speech disturbances: Secondary | ICD-10-CM | POA: Diagnosis not present

## 2018-02-17 DIAGNOSIS — G43019 Migraine without aura, intractable, without status migrainosus: Secondary | ICD-10-CM | POA: Diagnosis not present

## 2018-02-24 DIAGNOSIS — R079 Chest pain, unspecified: Secondary | ICD-10-CM | POA: Diagnosis not present

## 2018-02-24 DIAGNOSIS — M810 Age-related osteoporosis without current pathological fracture: Secondary | ICD-10-CM | POA: Diagnosis not present

## 2018-02-24 DIAGNOSIS — E782 Mixed hyperlipidemia: Secondary | ICD-10-CM | POA: Diagnosis not present

## 2018-02-24 DIAGNOSIS — R011 Cardiac murmur, unspecified: Secondary | ICD-10-CM | POA: Diagnosis not present

## 2018-02-24 DIAGNOSIS — G43009 Migraine without aura, not intractable, without status migrainosus: Secondary | ICD-10-CM | POA: Diagnosis not present

## 2018-03-21 DIAGNOSIS — R079 Chest pain, unspecified: Secondary | ICD-10-CM | POA: Diagnosis not present

## 2018-03-21 DIAGNOSIS — R011 Cardiac murmur, unspecified: Secondary | ICD-10-CM | POA: Diagnosis not present

## 2018-03-31 ENCOUNTER — Other Ambulatory Visit: Payer: Self-pay | Admitting: Cardiology

## 2018-03-31 DIAGNOSIS — I059 Rheumatic mitral valve disease, unspecified: Secondary | ICD-10-CM | POA: Diagnosis not present

## 2018-03-31 DIAGNOSIS — R0789 Other chest pain: Secondary | ICD-10-CM | POA: Diagnosis not present

## 2018-04-05 ENCOUNTER — Encounter
Admission: RE | Disposition: A | Discharge status: Home / Self Care | Payer: Self-pay | Source: Ambulatory Visit | Attending: Cardiology

## 2018-04-05 ENCOUNTER — Ambulatory Visit
Admission: RE | Admit: 2018-04-05 | Discharge: 2018-04-05 | Disposition: A | Discharge status: Home / Self Care | Payer: PPO | Source: Ambulatory Visit | Attending: Cardiology | Admitting: Cardiology

## 2018-04-05 ENCOUNTER — Encounter: Payer: Self-pay | Admitting: *Deleted

## 2018-04-05 DIAGNOSIS — Z882 Allergy status to sulfonamides status: Secondary | ICD-10-CM | POA: Insufficient documentation

## 2018-04-05 DIAGNOSIS — R079 Chest pain, unspecified: Secondary | ICD-10-CM | POA: Diagnosis not present

## 2018-04-05 DIAGNOSIS — R0789 Other chest pain: Secondary | ICD-10-CM | POA: Diagnosis not present

## 2018-04-05 DIAGNOSIS — G43909 Migraine, unspecified, not intractable, without status migrainosus: Secondary | ICD-10-CM | POA: Insufficient documentation

## 2018-04-05 DIAGNOSIS — Z881 Allergy status to other antibiotic agents status: Secondary | ICD-10-CM | POA: Insufficient documentation

## 2018-04-05 DIAGNOSIS — Z9889 Other specified postprocedural states: Secondary | ICD-10-CM | POA: Insufficient documentation

## 2018-04-05 DIAGNOSIS — Z7982 Long term (current) use of aspirin: Secondary | ICD-10-CM | POA: Insufficient documentation

## 2018-04-05 DIAGNOSIS — Z8249 Family history of ischemic heart disease and other diseases of the circulatory system: Secondary | ICD-10-CM | POA: Diagnosis not present

## 2018-04-05 DIAGNOSIS — Z90710 Acquired absence of both cervix and uterus: Secondary | ICD-10-CM | POA: Diagnosis not present

## 2018-04-05 DIAGNOSIS — R9430 Abnormal result of cardiovascular function study, unspecified: Secondary | ICD-10-CM | POA: Insufficient documentation

## 2018-04-05 DIAGNOSIS — K210 Gastro-esophageal reflux disease with esophagitis: Secondary | ICD-10-CM | POA: Insufficient documentation

## 2018-04-05 DIAGNOSIS — Z79899 Other long term (current) drug therapy: Secondary | ICD-10-CM | POA: Diagnosis not present

## 2018-04-05 DIAGNOSIS — Z888 Allergy status to other drugs, medicaments and biological substances status: Secondary | ICD-10-CM | POA: Diagnosis not present

## 2018-04-05 HISTORY — PX: LEFT HEART CATH AND CORONARY ANGIOGRAPHY: CATH118249

## 2018-04-05 HISTORY — DX: Migraine, unspecified, not intractable, without status migrainosus: G43.909

## 2018-04-05 SURGERY — LEFT HEART CATH AND CORONARY ANGIOGRAPHY
Anesthesia: Moderate Sedation | Laterality: Left

## 2018-04-05 MED ORDER — SODIUM CHLORIDE 0.9% FLUSH: 3.0000 mL | INTRAVENOUS | Status: DC | PRN

## 2018-04-05 MED ORDER — SODIUM CHLORIDE 0.9 % WEIGHT BASED INFUSION
3.0000 mL/kg/h | INTRAVENOUS | Status: AC
  Administered 2018-04-05: 3 mL/kg/h via INTRAVENOUS

## 2018-04-05 MED ORDER — SODIUM CHLORIDE 0.9% FLUSH: 3.0000 mL | Freq: Two times a day (BID) | INTRAVENOUS | Status: DC

## 2018-04-05 MED ORDER — ASPIRIN 81 MG PO CHEW
81.0000 mg | CHEWABLE_TABLET | ORAL | Status: AC
  Administered 2018-04-05: 81 mg via ORAL

## 2018-04-05 MED ORDER — HEPARIN (PORCINE) IN NACL 1000-0.9 UT/500ML-% IV SOLN
INTRAVENOUS | Status: AC
  Filled 2018-04-05: qty 1000

## 2018-04-05 MED ORDER — SODIUM CHLORIDE 0.9 % WEIGHT BASED INFUSION: 1.0000 mL/kg/h | INTRAVENOUS | Status: DC

## 2018-04-05 MED ORDER — LIDOCAINE HCL (PF) 1 % IJ SOLN
INTRAMUSCULAR | Status: AC
  Filled 2018-04-05: qty 30

## 2018-04-05 MED ORDER — FENTANYL CITRATE (PF) 100 MCG/2ML IJ SOLN
INTRAMUSCULAR | Status: AC
  Filled 2018-04-05: qty 2

## 2018-04-05 MED ORDER — MIDAZOLAM HCL 2 MG/2ML IJ SOLN
INTRAMUSCULAR | Status: DC | PRN
  Administered 2018-04-05: 1 mg via INTRAVENOUS

## 2018-04-05 MED ORDER — ONDANSETRON HCL 4 MG/2ML IJ SOLN: 4.0000 mg | Freq: Four times a day (QID) | INTRAMUSCULAR | Status: DC | PRN

## 2018-04-05 MED ORDER — SODIUM CHLORIDE 0.9 % IV SOLN: 250.0000 mL | INTRAVENOUS | Status: DC | PRN

## 2018-04-05 MED ORDER — IOPAMIDOL (ISOVUE-300) INJECTION 61%
INTRAVENOUS | Status: DC | PRN
  Administered 2018-04-05: 90 mL via INTRA_ARTERIAL

## 2018-04-05 MED ORDER — ACETAMINOPHEN 325 MG PO TABS: 650.0000 mg | ORAL_TABLET | ORAL | Status: DC | PRN

## 2018-04-05 MED ORDER — ASPIRIN 81 MG PO CHEW
CHEWABLE_TABLET | ORAL | Status: AC
  Filled 2018-04-05: qty 1

## 2018-04-05 MED ORDER — FENTANYL CITRATE (PF) 100 MCG/2ML IJ SOLN
INTRAMUSCULAR | Status: DC | PRN
  Administered 2018-04-05: 25 ug via INTRAVENOUS

## 2018-04-05 MED ORDER — MIDAZOLAM HCL 2 MG/2ML IJ SOLN
INTRAMUSCULAR | Status: AC
  Filled 2018-04-05: qty 2

## 2018-04-05 SURGICAL SUPPLY — 9 items
CATH INFINITI 5FR ANG PIGTAIL (CATHETERS) ×3 IMPLANT
CATH INFINITI 5FR JL4 (CATHETERS) ×3 IMPLANT
CATH INFINITI JR4 5F (CATHETERS) ×3 IMPLANT
DEVICE CLOSURE MYNXGRIP 5F (Vascular Products) ×3 IMPLANT
KIT MANI 3VAL PERCEP (MISCELLANEOUS) ×3 IMPLANT
NEEDLE PERC 18GX7CM (NEEDLE) ×3 IMPLANT
PACK CARDIAC CATH (CUSTOM PROCEDURE TRAY) ×3 IMPLANT
SHEATH AVANTI 5FR X 11CM (SHEATH) ×3 IMPLANT
WIRE GUIDERIGHT .035X150 (WIRE) ×3 IMPLANT

## 2018-04-05 NOTE — H&P (Signed)
Chief Complaint: Chief Complaint  Patient presents with  . Establish Care  abnormal stress echo per dr. Sabra Heck  Date of Service: 03/31/2018 Date of Birth: 04-Sep-1951 PCP: Sheron Nightingale, PA  History of Present Illness: Brittney Campos is a 67 y.o.female patient who presents for evaluation and referral for an abnormal stress echo. Patient has some episodes of chest pain. This typically occurs predominantly with high stress situations however she has had it on occasion with exertion. She has no history of diabetes, hypertension, hyperlipidemia or tobacco use. She has some family history of heart disease. She underwent an exercise stress echo which was read as showing inferior ischemia. She states she did not feel much chest pain during that event. She denies syncope or presyncope orthopnea or PND.  Past Medical and Surgical History  Past Medical History Past Medical History:  Diagnosis Date  . Bladder infection  . Bladder infection, chronic  . Edema  recurrent  . Esophageal reflux  . Incomplete bladder emptying  . Migraine  . Migraine  . Mitral valve disorders(424.0)  . Reflux esophagitis 01/26/2017  . Sciatica  . Senile osteoporosis  Actonel  . Senile osteoporosis  . Urgency of urination   Past Surgical History She has a past surgical history that includes bladder sling; Breast excisional biopsy; Hysterectomy; Shoulder surgery; and egd (01/26/2017).   Medications and Allergies  Current Medications  Current Outpatient Medications  Medication Sig Dispense Refill  . ALPRAZolam (XANAX) 0.25 MG tablet Take 1 tablet (0.25 mg total) by mouth 3 (three) times daily as needed. 45 tablet 2  . BIOTIN ORAL Take 5,000 mcg by mouth once daily.  . calcium carbonate 600 mg (1,500 mg) Tab tablet Take 600 mg by mouth.  . cyclobenzaprine (FLEXERIL) 5 MG tablet TAKE (1) TABLET BY MOUTH TWICE DAILY AS NEEDED FOR MUSCLE SPASMS 60 tablet 0  . estradiol (VAGIFEM) 10 mcg vaginal tablet Place 1 tablet  (10 mcg total) vaginally twice a week. 8 tablet 5  . IBUPROFEN ORAL Take by mouth.  . miscellaneous medical supply Misc Emgality (galcanezumab-gnlm) two 120mg  subcutaneous injections for the first month then one 120mg  subcutaneous injection monthly 1 each 5  . multivitamin tablet Take 1 tablet by mouth once daily.  . pantoprazole (PROTONIX) 40 MG DR tablet Take 1 tablet (40 mg total) by mouth once daily. 90 tablet 3  . promethazine (PHENERGAN) 25 MG tablet Take 1 tablet (25 mg total) by mouth once daily as needed for Nausea. 15 tablet 2  . ranitidine (ZANTAC) 300 MG tablet Take 1 tablet (300 mg total) by mouth nightly. 90 tablet 3  . aspirin 81 MG EC tablet Take 1 tablet (81 mg total) by mouth once daily 30 tablet 11  . SUMAtriptan (IMITREX) 100 MG tablet TAKE 1 TABLET BY MOUTH ONCE AS NEEDED FOR MIGRAINE FOR UP TO 1 DOSE. 9 tablet 11   No current facility-administered medications for this visit.   Allergies: Biaxin [clarithromycin]; Fosamax [alendronate]; Prednisone; Sulfamethoxazole-trimethoprim; Macrobid [nitrofurantoin monohyd/m-cryst]; and Venlafaxine  Social and Family History  Social History reports that she has never smoked. She has never used smokeless tobacco. She reports that she does not drink alcohol or use drugs.  Family History Family History  Problem Relation Age of Onset  . Heart disease Father  . High blood pressure (Hypertension) Father  . Colon cancer Father  . Diabetes Brother   Review of Systems  Review of Systems  Constitutional: Negative for chills, diaphoresis, fever, malaise/fatigue and weight loss.  HENT:  Negative for congestion, ear discharge, hearing loss and tinnitus.  Eyes: Negative for blurred vision.  Respiratory: Negative for cough, hemoptysis, sputum production, shortness of breath and wheezing.  Cardiovascular: Positive for chest pain. Negative for palpitations, orthopnea, claudication, leg swelling and PND.  Gastrointestinal: Negative for  abdominal pain, blood in stool, constipation, diarrhea, heartburn, melena, nausea and vomiting.  Genitourinary: Negative for dysuria, frequency, hematuria and urgency.  Musculoskeletal: Negative for back pain, falls, joint pain and myalgias.  Skin: Negative for itching and rash.  Neurological: Negative for dizziness, tingling, focal weakness, loss of consciousness, weakness and headaches.  Endo/Heme/Allergies: Negative for polydipsia. Does not bruise/bleed easily.  Psychiatric/Behavioral: Negative for depression, memory loss and substance abuse. The patient is not nervous/anxious.   Physical Examination   Vitals:BP 120/70  Pulse 68  Resp 12  Ht 165.1 cm (5\' 5" )  Wt 71.7 kg (158 lb)  LMP (LMP Unknown)  BMI 26.29 kg/m  Ht:165.1 cm (5\' 5" ) Wt:71.7 kg (158 lb) MEQ:ASTM surface area is 1.81 meters squared. Body mass index is 26.29 kg/m.  Wt Readings from Last 3 Encounters:  03/31/18 71.7 kg (158 lb)  03/21/18 71.5 kg (157 lb 9.6 oz)  02/24/18 72.9 kg (160 lb 12.8 oz)   BP Readings from Last 3 Encounters:  03/31/18 120/70  02/24/18 120/78  02/17/18 131/73   General appearance appears in no acute distress  Head Mouth and Eye exam Normocephalic, without obvious abnormality, atraumatic Dentition is good Eyes appear anicteric   Neck exam Thyroid: normal  Nodes: no obvious adenopathy  LUNGS Breath Sounds: Normal Percussion: Normal  CARDIOVASCULAR JVP CV wave: no HJR: no Elevation at 90 degrees: None Carotid Pulse: normal pulsation bilaterally Bruit: None Apex: apical impulse normal  Auscultation Rhythm: normal sinus rhythm S1: normal S2: normal Clicks: no Rub: no Murmurs: no murmurs  Gallop: None ABDOMEN Liver enlargement: no Pulsatile aorta: no Ascites: no Bruits: no  EXTREMITIES Clubbing: no Edema: trace to 1+ bilateral pedal edema Pulses: peripheral pulses symmetrical Femoral Bruits: no Amputation: no SKIN Rash: no Cyanosis: no Embolic  phemonenon: no Bruising: no NEURO Alert and Oriented to person, place and time: yes Non focal: yes  PSYCH: Pt appears to have normal affect  LABS REVIEWED Last 3 CBC results: Lab Results  Component Value Date  WBC 7.6 08/18/2017  WBC 6.4 06/18/2016  WBC 7.1 04/30/2015   Lab Results  Component Value Date  HGB 13.8 08/18/2017  HGB 13.5 06/18/2016  HGB 14.2 04/30/2015   Lab Results  Component Value Date  HCT 43.3 08/18/2017  HCT 41.5 06/18/2016  HCT 43.5 04/30/2015   Lab Results  Component Value Date  PLT 235 08/18/2017  PLT 220 06/18/2016  PLT 214 04/30/2015   Lab Results  Component Value Date  CREATININE 0.9 08/18/2017  BUN 11 08/18/2017  NA 142 08/18/2017  K 4.0 08/18/2017  CL 108 08/18/2017  CO2 28.3 08/18/2017   Lab Results  Component Value Date  HGBA1C 5.5 08/18/2017   Lab Results  Component Value Date  HDL 61.3 08/18/2017  HDL 60.4 06/18/2016  HDL 63.8 04/30/2015   Lab Results  Component Value Date  LDLCALC 88 08/18/2017  LDLCALC 84 06/18/2016  LDLCALC 92 04/30/2015   Lab Results  Component Value Date  TRIG 131 08/18/2017  TRIG 89 06/18/2016  TRIG 126 04/30/2015   Lab Results  Component Value Date  ALT 9 08/18/2017  AST 16 08/18/2017  ALKPHOS 80 08/18/2017   Lab Results  Component Value Date  TSH 1.797 08/18/2017  Diagnostic Studies Reviewed:  EKG EKG demonstrated normal sinus rhythm, nonspecific ST and T waves changes.  Assessment and Plan   67 y.o. female with  ICD-10-CM ICD-9-CM  1. Atypical chest pain-pain is not necessarily exertional. She underwent a stress echo which was read as showing inferior ischemia with fair exercise tolerance. Given persistent symptoms and abnormal stress echo suggesting high risk for coronary disease we will proceed with left heart cath to evaluate coronary anatomy. Further recommendations after this is complete. R07.89 882.80 Basic Metabolic Panel (BMP)  CBC w/auto Differential (5 Part)  2.  Mitral valve disease-has mild mitral valve prolapse. Will follow. I05.9 394.9   Return in about 3 weeks (around 04/21/2018).  These notes generated with voice recognition software. I apologize for typographical errors.  Sydnee Levans, MD     Pt seen and examined. No change from above.

## 2018-04-29 DIAGNOSIS — I059 Rheumatic mitral valve disease, unspecified: Secondary | ICD-10-CM | POA: Diagnosis not present

## 2018-04-29 DIAGNOSIS — R0789 Other chest pain: Secondary | ICD-10-CM | POA: Diagnosis not present

## 2018-05-23 DIAGNOSIS — G4700 Insomnia, unspecified: Secondary | ICD-10-CM | POA: Diagnosis not present

## 2018-05-23 DIAGNOSIS — G43019 Migraine without aura, intractable, without status migrainosus: Secondary | ICD-10-CM | POA: Diagnosis not present

## 2018-05-23 DIAGNOSIS — R4789 Other speech disturbances: Secondary | ICD-10-CM | POA: Diagnosis not present

## 2018-06-22 DIAGNOSIS — L814 Other melanin hyperpigmentation: Secondary | ICD-10-CM | POA: Diagnosis not present

## 2018-06-22 DIAGNOSIS — L578 Other skin changes due to chronic exposure to nonionizing radiation: Secondary | ICD-10-CM | POA: Diagnosis not present

## 2018-06-22 DIAGNOSIS — Z85828 Personal history of other malignant neoplasm of skin: Secondary | ICD-10-CM | POA: Diagnosis not present

## 2018-06-22 DIAGNOSIS — L570 Actinic keratosis: Secondary | ICD-10-CM | POA: Diagnosis not present

## 2018-06-27 DIAGNOSIS — M79674 Pain in right toe(s): Secondary | ICD-10-CM | POA: Diagnosis not present

## 2018-06-27 DIAGNOSIS — B351 Tinea unguium: Secondary | ICD-10-CM | POA: Diagnosis not present

## 2018-08-10 DIAGNOSIS — K219 Gastro-esophageal reflux disease without esophagitis: Secondary | ICD-10-CM | POA: Diagnosis not present

## 2018-08-10 DIAGNOSIS — Z800 Family history of malignant neoplasm of digestive organs: Secondary | ICD-10-CM | POA: Diagnosis not present

## 2018-08-11 DIAGNOSIS — R0789 Other chest pain: Secondary | ICD-10-CM | POA: Diagnosis not present

## 2018-08-11 DIAGNOSIS — I059 Rheumatic mitral valve disease, unspecified: Secondary | ICD-10-CM | POA: Diagnosis not present

## 2018-08-22 DIAGNOSIS — R7303 Prediabetes: Secondary | ICD-10-CM | POA: Diagnosis not present

## 2018-08-22 DIAGNOSIS — Z0000 Encounter for general adult medical examination without abnormal findings: Secondary | ICD-10-CM | POA: Diagnosis not present

## 2018-08-22 DIAGNOSIS — E782 Mixed hyperlipidemia: Secondary | ICD-10-CM | POA: Diagnosis not present

## 2018-08-29 DIAGNOSIS — M810 Age-related osteoporosis without current pathological fracture: Secondary | ICD-10-CM | POA: Diagnosis not present

## 2018-08-29 DIAGNOSIS — Z1159 Encounter for screening for other viral diseases: Secondary | ICD-10-CM | POA: Diagnosis not present

## 2018-08-29 DIAGNOSIS — E782 Mixed hyperlipidemia: Secondary | ICD-10-CM | POA: Diagnosis not present

## 2018-08-29 DIAGNOSIS — Z0000 Encounter for general adult medical examination without abnormal findings: Secondary | ICD-10-CM | POA: Diagnosis not present

## 2018-08-29 DIAGNOSIS — Z1239 Encounter for other screening for malignant neoplasm of breast: Secondary | ICD-10-CM | POA: Diagnosis not present

## 2018-08-29 DIAGNOSIS — R7303 Prediabetes: Secondary | ICD-10-CM | POA: Diagnosis not present

## 2018-08-29 DIAGNOSIS — G43009 Migraine without aura, not intractable, without status migrainosus: Secondary | ICD-10-CM | POA: Diagnosis not present

## 2018-08-30 ENCOUNTER — Other Ambulatory Visit: Payer: Self-pay | Admitting: Physician Assistant

## 2018-08-30 DIAGNOSIS — Z1231 Encounter for screening mammogram for malignant neoplasm of breast: Secondary | ICD-10-CM

## 2018-09-05 DIAGNOSIS — M810 Age-related osteoporosis without current pathological fracture: Secondary | ICD-10-CM | POA: Diagnosis not present

## 2018-09-19 ENCOUNTER — Other Ambulatory Visit: Payer: Self-pay

## 2018-09-19 ENCOUNTER — Ambulatory Visit (INDEPENDENT_AMBULATORY_CARE_PROVIDER_SITE_OTHER): Payer: PPO

## 2018-09-19 ENCOUNTER — Encounter: Payer: Self-pay | Admitting: Emergency Medicine

## 2018-09-19 ENCOUNTER — Ambulatory Visit
Admission: EM | Admit: 2018-09-19 | Discharge: 2018-09-19 | Disposition: A | Discharge status: Home / Self Care | Payer: PPO | Attending: Internal Medicine | Admitting: Internal Medicine

## 2018-09-19 DIAGNOSIS — Y93K1 Activity, walking an animal: Secondary | ICD-10-CM | POA: Diagnosis not present

## 2018-09-19 DIAGNOSIS — M25571 Pain in right ankle and joints of right foot: Secondary | ICD-10-CM | POA: Diagnosis not present

## 2018-09-19 DIAGNOSIS — S99911A Unspecified injury of right ankle, initial encounter: Secondary | ICD-10-CM | POA: Diagnosis not present

## 2018-09-19 DIAGNOSIS — W172XXA Fall into hole, initial encounter: Secondary | ICD-10-CM | POA: Diagnosis not present

## 2018-09-19 DIAGNOSIS — M7989 Other specified soft tissue disorders: Secondary | ICD-10-CM | POA: Diagnosis not present

## 2018-09-19 MED ORDER — DICLOFENAC SODIUM 1 % TD GEL: 2.0000 g | Freq: Four times a day (QID) | TRANSDERMAL | 0 refills | Status: DC

## 2018-09-19 NOTE — ED Provider Notes (Signed)
[facility]    CSN: [id number] Arrival date & time: 09/19/18  1503     History   Chief Complaint Chief Complaint  Patient presents with  . Ankle Injury    HPI Brittney Campos is a 67 y.o. female.   She was walking her dog last week, who pulled on her, but was not having any pain when she returned and does not recall an injury then, then 4 days later developed lateral ankle pain which radiated to the back. Pain is provoked  With walking, moving foot up and down and pressing on the pedal when driving. Has tried Tylenol and Advil has not helped at all. Has not iced it. States that at the end of September her R foot went into a hole and landed on her knees, but does not recall having R foot or ankle pain then. She injured her R ankle a few months prior to that and healed on her own.      Past Medical History:  Diagnosis Date  . Bladder infection, chronic   . GERD (gastroesophageal reflux disease)   . Headache   . Migraine   . Mitral valve disorder   . Senile osteoporosis     There are no active problems to display for this patient.   Past Surgical History:  Procedure Laterality Date  . ABDOMINAL HYSTERECTOMY    . BREAST EXCISIONAL BIOPSY Left 08/21/04   neg  . ESOPHAGOGASTRODUODENOSCOPY (EGD) WITH PROPOFOL N/A 01/26/2017   Procedure: ESOPHAGOGASTRODUODENOSCOPY (EGD) WITH PROPOFOL;  Surgeon: Lollie Sails, MD;  Location: [facility] ENDOSCOPY;  Service: Endoscopy;  Laterality: N/A;  . FL INJ RT SHOULDER CT ARTHROGRAM ([facility] HX)    . INCONTINENCE SURGERY    . LEFT HEART CATH AND CORONARY ANGIOGRAPHY Left 04/05/2018   Procedure: LEFT HEART CATH AND CORONARY ANGIOGRAPHY;  Surgeon: Teodoro Spray, MD;  Location: [facility];  Service: Cardiovascular;  Laterality: Left;    OB History   None      Home Medications    Prior to Admission medications   Medication Sig Start Date End Date Taking? Authorizing Provider  ALPRAZolam (XANAX) 0.25 MG tablet  Take 0.25 mg by mouth 3 (three) times daily as needed for anxiety.   Yes [provider]  calcium carbonate (OSCAL) 1500 (600 Ca) MG TABS tablet Take 600 mg of elemental calcium by mouth daily. Plus Vit D3   Yes [provider]  cyclobenzaprine (FLEXERIL) 5 MG tablet Take 5 mg by mouth 2 (two) times daily as needed for muscle spasms.   Yes [provider]  Galcanezumab-gnlm (EMGALITY) 120 MG/ML SOAJ Inject 120 mg into the skin every 30 (thirty) days.   Yes [provider]  metoprolol succinate (TOPROL-XL) 25 MG 24 hr tablet Take by mouth. 04/29/18 04/29/19 Yes [provider]  Multiple Vitamin (MULTIVITAMIN) tablet Take 1 tablet by mouth daily.   Yes [provider]  pantoprazole (PROTONIX) 40 MG tablet Take 40 mg by mouth daily.   Yes [provider]  promethazine (PHENERGAN) 25 MG tablet Take 25 mg by mouth every 8 (eight) hours as needed for nausea or vomiting.    Yes [provider]  SUMAtriptan (IMITREX) 100 MG tablet Take 100 mg by mouth every 2 (two) hours as needed for migraine. May repeat in 2 hours if headache persists or recurs.   Yes [provider]  diclofenac sodium (VOLTAREN) 1 % GEL Apply 2 g topically 4 (four) times daily. 09/19/18  Rodriguez-Southworth, Sunday Spillers, PA-C    Family History Family History  Problem Relation Age of Onset  . Breast cancer Neg Hx     Social History Social History   Tobacco Use  . Smoking status: Never Smoker  . Smokeless tobacco: Never Used  Substance Use Topics  . Alcohol use: No  . Drug use: No     Allergies   Biaxin [clarithromycin]; Fosamax [alendronate sodium]; Macrobid [nitrofurantoin macrocrystal]; Prednisone; Sulfamethoxazole-trimethoprim; and Venlafaxine   Review of Systems Review of Systems  Neurological: Negative for numbness.       Has a constant knife sharpness and burning.      Physical Exam Triage Vital Signs ED Triage Vitals [09/19/18  1521]  Enc Vitals Group     BP 135/82     Pulse Rate 69     Resp 18     Temp 98.2 F (36.8 C)     Temp Source Oral     SpO2 100 %     Weight 169 lb (76.7 kg)     Height 5\' 2"  (1.575 m)     Head Circumference      Peak Flow      Pain Score 10     Pain Loc      Pain Edu?      Excl. in [place]?    No data found.  Updated Vital Signs BP 135/82 (BP Location: Left Arm)   Pulse 69   Temp 98.2 F (36.8 C) (Oral)   Resp 18   Ht 5\' 2"  (1.575 m)   Wt 169 lb (76.7 kg)   SpO2 100%   BMI 30.91 kg/m   Visual Acuity Right Eye Distance:   Left Eye Distance:   Bilateral Distance:    Right Eye Near:   Left Eye Near:    Bilateral Near:     Physical Exam  Constitutional: She is oriented to person, place, and time.  HENT:  Head: Normocephalic.  Right Ear: External ear normal.  Left Ear: External ear normal.  Nose: Nose normal.  Eyes: Conjunctivae are normal. No scleral icterus.  Neck: Neck supple.  Pulmonary/Chest: Effort normal.  Musculoskeletal: She exhibits tenderness. She exhibits no edema.  Has swelling of R lateral malleolus, more posteriorly and superior/ anterior. Has point tenderness on achillis insertion at the heel and laterally between the ankle bone and the heel. This area feels more prominent than the L. Her xray did show a posterior bone Spurr, so may be this. Dorsiextemsion seems to cause more pain than flexion. Normal Thompson test, achillis is not tender  Neurological: She is oriented to person, place, and time. No sensory deficit. She exhibits normal muscle tone.  Skin: Skin is warm and dry. Capillary refill takes less than 2 seconds.  Her R foot is slightly more purplish/red than the L, but is not hot, clammy. Does not have hyperalgesia of area of pain.   Psychiatric: She has a normal mood and affect. Her behavior is normal. Judgment normal.     UC Treatments / Results  Labs (all labs ordered are listed, but only abnormal results are displayed) Labs Reviewed  - No data to display  EKG None  Radiology R ankle shows no fracture, I see a possible Spurr posteriorly. No fracture per radiology.   Procedures   Medications Ordered in UC Medications - No data to display  Initial Impression / Assessment and Plan / UC Course  I have reviewed the triage vital signs and the nursing notes. I  explained to her I believe her pain is possibly due to OA's, but with her feeling the burning sensation I reviewed Symptoms of RSD and needs to make sure she FU with PCP.   Pertinent imaging results that were available during my care of the patient were reviewed by me and considered in my medical decision making (see chart for details).     Final Clinical Impressions(s) / UC Diagnoses   Final diagnoses:  Acute right ankle pain   Discharge Instructions   None    ED Prescriptions    Medication Sig Dispense Auth. Provider   diclofenac sodium (VOLTAREN) 1 % GEL Apply 2 g topically 4 (four) times daily. 100 g Rodriguez-Southworth, Sunday Spillers, PA-C     Controlled Substance Prescriptions  Controlled Substance Registry consulted? no   Shelby Mattocks, [place] 09/19/18 1715

## 2018-09-19 NOTE — ED Triage Notes (Signed)
Patient c/o right ankle pain that started 3 weeks ago when she walking the dog and fell into a hole. She stated last week she was walking the dog and re injured her right ankle. Patient reports some swelling to right ankle.

## 2018-09-29 ENCOUNTER — Ambulatory Visit: Payer: PPO | Admitting: Anesthesiology

## 2018-09-29 ENCOUNTER — Ambulatory Visit
Admission: RE | Admit: 2018-09-29 | Discharge: 2018-09-29 | Disposition: A | Discharge status: Home / Self Care | Payer: PPO | Source: Ambulatory Visit | Attending: Gastroenterology | Admitting: Gastroenterology

## 2018-09-29 ENCOUNTER — Encounter: Payer: Self-pay | Admitting: *Deleted

## 2018-09-29 ENCOUNTER — Encounter
Admission: RE | Disposition: A | Discharge status: Home / Self Care | Payer: Self-pay | Source: Ambulatory Visit | Attending: Gastroenterology

## 2018-09-29 DIAGNOSIS — I059 Rheumatic mitral valve disease, unspecified: Secondary | ICD-10-CM | POA: Diagnosis not present

## 2018-09-29 DIAGNOSIS — K5730 Diverticulosis of large intestine without perforation or abscess without bleeding: Secondary | ICD-10-CM | POA: Diagnosis not present

## 2018-09-29 DIAGNOSIS — Z7982 Long term (current) use of aspirin: Secondary | ICD-10-CM | POA: Insufficient documentation

## 2018-09-29 DIAGNOSIS — Z79899 Other long term (current) drug therapy: Secondary | ICD-10-CM | POA: Diagnosis not present

## 2018-09-29 DIAGNOSIS — K635 Polyp of colon: Secondary | ICD-10-CM | POA: Diagnosis not present

## 2018-09-29 DIAGNOSIS — Z1211 Encounter for screening for malignant neoplasm of colon: Secondary | ICD-10-CM | POA: Diagnosis not present

## 2018-09-29 DIAGNOSIS — Z800 Family history of malignant neoplasm of digestive organs: Secondary | ICD-10-CM | POA: Insufficient documentation

## 2018-09-29 DIAGNOSIS — Z888 Allergy status to other drugs, medicaments and biological substances status: Secondary | ICD-10-CM | POA: Insufficient documentation

## 2018-09-29 DIAGNOSIS — K219 Gastro-esophageal reflux disease without esophagitis: Secondary | ICD-10-CM | POA: Insufficient documentation

## 2018-09-29 DIAGNOSIS — Z881 Allergy status to other antibiotic agents status: Secondary | ICD-10-CM | POA: Insufficient documentation

## 2018-09-29 DIAGNOSIS — Z882 Allergy status to sulfonamides status: Secondary | ICD-10-CM | POA: Diagnosis not present

## 2018-09-29 DIAGNOSIS — K5790 Diverticulosis of intestine, part unspecified, without perforation or abscess without bleeding: Secondary | ICD-10-CM | POA: Diagnosis not present

## 2018-09-29 DIAGNOSIS — M810 Age-related osteoporosis without current pathological fracture: Secondary | ICD-10-CM | POA: Insufficient documentation

## 2018-09-29 DIAGNOSIS — Z86010 Personal history of colonic polyps: Secondary | ICD-10-CM | POA: Diagnosis not present

## 2018-09-29 DIAGNOSIS — D125 Benign neoplasm of sigmoid colon: Secondary | ICD-10-CM | POA: Diagnosis not present

## 2018-09-29 HISTORY — DX: Sciatica, unspecified side: M54.30

## 2018-09-29 HISTORY — PX: COLONOSCOPY WITH PROPOFOL: SHX5780

## 2018-09-29 SURGERY — COLONOSCOPY WITH PROPOFOL
Anesthesia: General

## 2018-09-29 MED ORDER — LIDOCAINE HCL (CARDIAC) PF 100 MG/5ML IV SOSY
PREFILLED_SYRINGE | INTRAVENOUS | Status: DC | PRN
  Administered 2018-09-29: 100 mg via INTRAVENOUS

## 2018-09-29 MED ORDER — PROPOFOL 500 MG/50ML IV EMUL
INTRAVENOUS | Status: AC
  Filled 2018-09-29: qty 50

## 2018-09-29 MED ORDER — PROPOFOL 10 MG/ML IV BOLUS
INTRAVENOUS | Status: DC | PRN
  Administered 2018-09-29 (×6): 20 mg via INTRAVENOUS
  Administered 2018-09-29: 100 mg via INTRAVENOUS
  Administered 2018-09-29: 20 mg via INTRAVENOUS

## 2018-09-29 MED ORDER — SODIUM CHLORIDE 0.9 % IV SOLN
INTRAVENOUS | Status: DC
  Administered 2018-09-29: 13:00:00 via INTRAVENOUS

## 2018-09-29 MED ORDER — PROPOFOL 500 MG/50ML IV EMUL
INTRAVENOUS | Status: DC | PRN
  Administered 2018-09-29: 130 ug/kg/min via INTRAVENOUS

## 2018-09-29 NOTE — H&P (Signed)
Outpatient short stay form Pre-procedure 09/29/2018 12:41 PM Lollie Sails MD  Primary Physician: Boykin Reaper, NP  Reason for visit: Colonoscopy  History of present illness: Patient is a 67 year old female presenting today for colonoscopy because of a family history of colon cancer primary relative, father.  Her last colonoscopy was 04/10/2013- for polyps at that time.  She tolerated her prep well.  She takes no aspirin or blood thinning agent with the exception of 81 mg aspirin that she takes every other day and has not taken that for about 5 days.   No current facility-administered medications for this encounter.   Medications Prior to Admission  Medication Sig Dispense Refill Last Dose  . ALPRAZolam (XANAX) 0.25 MG tablet Take 0.25 mg by mouth 3 (three) times daily as needed for anxiety.   Past Week at Unknown time  . pantoprazole (PROTONIX) 40 MG tablet Take 40 mg by mouth daily.   Past Week at Unknown time  . calcium carbonate (OSCAL) 1500 (600 Ca) MG TABS tablet Take 600 mg of elemental calcium by mouth daily. Plus Vit D3   04/04/2018 at Unknown time  . cyclobenzaprine (FLEXERIL) 5 MG tablet Take 5 mg by mouth 2 (two) times daily as needed for muscle spasms.   Past Week at Unknown time  . diclofenac sodium (VOLTAREN) 1 % GEL Apply 2 g topically 4 (four) times daily. 100 g 0   . Galcanezumab-gnlm (EMGALITY) 120 MG/ML SOAJ Inject 120 mg into the skin every 30 (thirty) days.   04/04/2018 at Unknown time  . metoprolol succinate (TOPROL-XL) 25 MG 24 hr tablet Take by mouth.     . Multiple Vitamin (MULTIVITAMIN) tablet Take 1 tablet by mouth daily.   04/04/2018 at Unknown time  . promethazine (PHENERGAN) 25 MG tablet Take 25 mg by mouth every 8 (eight) hours as needed for nausea or vomiting.    Past Month at Unknown time  . SUMAtriptan (IMITREX) 100 MG tablet Take 100 mg by mouth every 2 (two) hours as needed for migraine. May repeat in 2 hours if headache persists or recurs.   Past Week at  Unknown time     Allergies  Allergen Reactions  . Biaxin [Clarithromycin] Other (See Comments)    Stomach problems  . Fosamax [Alendronate Sodium] Other (See Comments)    Stomach problems  . Macrobid [Nitrofurantoin Macrocrystal] Other (See Comments)    Stomach problems  . Prednisone     Makes pt nervous, stomach problems  . Sulfamethoxazole-Trimethoprim Other (See Comments)    Stomach problems  . Venlafaxine Other (See Comments)    Stomach problems     Past Medical History:  Diagnosis Date  . Bladder infection, chronic   . GERD (gastroesophageal reflux disease)   . Headache   . Migraine   . Mitral valve disorder   . Sciatica   . Senile osteoporosis     Review of systems:      Physical Exam    Heart and lungs: Regular rate and rhythm without rub or gallop, lungs are bilaterally clear.    HEENT: Normocephalic atraumatic eyes are anicteric    Other:    Pertinant exam for procedure: Soft nontender nondistended bowel sounds positive normoactive.    Planned proceedures: Colonoscopy and indicated procedures. I have discussed the risks benefits and complications of procedures to include not limited to bleeding, infection, perforation and the risk of sedation and the patient wishes to proceed.    Lollie Sails, MD Gastroenterology 09/29/2018  12:41  PM

## 2018-09-29 NOTE — Anesthesia Post-op Follow-up Note (Signed)
Anesthesia QCDR form completed.        

## 2018-09-29 NOTE — Anesthesia Preprocedure Evaluation (Addendum)
Anesthesia Evaluation  Patient identified by MRN, date of birth, ID band Patient awake    Reviewed: Allergy & Precautions, H&P , NPO status , reviewed documented beta blocker date and time   Airway Mallampati: II  TM Distance: >3 FB     Dental  (+) Teeth Intact, Missing Irregular teeth alignment:   Pulmonary    Pulmonary exam normal        Cardiovascular Normal cardiovascular exam  Cath 12/2017 The left ventricular systolic function is normal. LV end diastolic pressure is normal. There is no aortic valve stenosis. There is no mitral valve stenosis and no mitral valve prolapse evident.   EF normal Coronary arteries are normal Risk factor optimization   Neuro/Psych  Headaches,  Neuromuscular disease    GI/Hepatic GERD  Medicated and Controlled,  Endo/Other    Renal/GU      Musculoskeletal   Abdominal   Peds  Hematology   Anesthesia Other Findings Past Medical History: No date: Bladder infection, chronic No date: GERD (gastroesophageal reflux disease) No date: Headache No date: Migraine No date: Mitral valve disorder No date: Sciatica No date: Senile osteoporosis  Past Surgical History: No date: ABDOMINAL HYSTERECTOMY 08/21/04: BREAST EXCISIONAL BIOPSY; Left     Comment:  neg No date: COLONOSCOPY W/ POLYPECTOMY 01/26/2017: ESOPHAGOGASTRODUODENOSCOPY (EGD) WITH PROPOFOL; N/A     Comment:  Procedure: ESOPHAGOGASTRODUODENOSCOPY (EGD) WITH               PROPOFOL;  Surgeon: Lollie Sails, MD;  Location:               [facility] ENDOSCOPY;  Service: Endoscopy;  Laterality: N/A; No date: FL INJ RT SHOULDER CT ARTHROGRAM ([facility] HX) No date: INCONTINENCE SURGERY 04/05/2018: LEFT HEART CATH AND CORONARY ANGIOGRAPHY; Left     Comment:  Procedure: LEFT HEART CATH AND CORONARY ANGIOGRAPHY;                Surgeon: Teodoro Spray, MD;  Location: [facility];  Service: Cardiovascular;  Laterality:  Left;     Reproductive/Obstetrics                            Anesthesia Physical Anesthesia Plan  ASA: II  Anesthesia Plan: General   Post-op Pain Management:    Induction: Intravenous  PONV Risk Score and Plan: Treatment may vary due to age or medical condition and TIVA  Airway Management Planned: Nasal Cannula and Natural Airway  Additional Equipment:   Intra-op Plan:   Post-operative Plan:   Informed Consent: I have reviewed the patients History and Physical, chart, labs and discussed the procedure including the risks, benefits and alternatives for the proposed anesthesia with the patient or authorized representative who has indicated his/her understanding and acceptance.   Dental Advisory Given  Plan Discussed with:   Anesthesia Plan Comments:        Anesthesia Quick Evaluation

## 2018-09-29 NOTE — Op Note (Signed)
[facility] Patient Name: Brittney Campos Procedure Date: 09/29/2018 12:39 PM MRN: [id number] Account #: [id number] Date of Birth: [birth year] Admit Type: Outpatient Age: 67 Room: [facility] ENDO ROOM 1 Gender: Female Note Status: Finalized Procedure:            Colonoscopy Indications:          Family history of colon cancer in a first-degree                        relative Providers:            Lollie Sails, MD Referring MD:         Precious Bard, MD (Referring MD) Medicines:            Monitored Anesthesia Care Complications:        No immediate complications. Procedure:            Pre-Anesthesia Assessment:                       - ASA Grade Assessment: II - A patient with mild                        systemic disease.                       After obtaining informed consent, the colonoscope was                        passed under direct vision. Throughout the procedure,                        the patient's blood pressure, pulse, and oxygen                        saturations were monitored continuously. The                        Colonoscope was introduced through the anus and                        advanced to the the cecum, identified by appendiceal                        orifice and ileocecal valve. The quality of the bowel                        preparation was good. The colonoscopy was unusually                        difficult due to a redundant colon, significant looping                        and a tortuous colon. Successful completion of the                        procedure was aided by changing the patient to a supine                        position, changing the patient to a prone position,  using manual pressure and withdrawing and reinserting                        the scope. The quality of the bowel preparation was                        good. Findings:      The sigmoid colon, descending colon, transverse  colon and ascending       colon were significantly redundant.      Multiple small-mouthed diverticula were found in the sigmoid colon and       distal descending colon.      Two sessile polyps were found in the sigmoid colon. The polyps were 2 to       3 mm in size. These polyps were removed with a cold biopsy forceps.       Resection and retrieval were complete.      The retroflexed view of the distal rectum and anal verge was normal and       showed no anal or rectal abnormalities. Impression:           - Redundant colon.                       - Diverticulosis in the sigmoid colon and in the distal                        descending colon.                       - Two 2 to 3 mm polyps in the sigmoid colon, removed                        with a cold biopsy forceps. Resected and retrieved.                       - The distal rectum and anal verge are normal on                        retroflexion view. Recommendation:       - Discharge patient to home. Procedure Code(s):    --- Professional ---                       [phone], Colonoscopy, flexible; with biopsy, single or                        multiple Diagnosis Code(s):    --- Professional ---                       D12.5, Benign neoplasm of sigmoid colon                       Z80.0, Family history of malignant neoplasm of                        digestive organs                       K57.30, Diverticulosis of large intestine without                        perforation  or abscess without bleeding                       Q43.8, Other specified congenital malformations of                        intestine CPT copyright 2018 American Medical Association. All rights reserved. The codes documented in this report are preliminary and upon coder review may  be revised to meet current compliance requirements. Lollie Sails, MD 09/29/2018 2:00:33 PM This report has been signed electronically. Number of Addenda: 0 Note Initiated On: 09/29/2018 12:39  PM Scope Withdrawal Time: 0 hours 8 minutes 1 second  Total Procedure Duration: 0 hours 52 minutes 1 second       [facility]

## 2018-09-29 NOTE — Anesthesia Postprocedure Evaluation (Signed)
Anesthesia Post Note  Patient: Brittney Campos  Procedure(s) Performed: COLONOSCOPY WITH PROPOFOL (N/A )  Patient location during evaluation: Endoscopy Anesthesia Type: General Level of consciousness: awake and alert Pain management: pain level controlled Vital Signs Assessment: post-procedure vital signs reviewed and stable Respiratory status: spontaneous breathing, nonlabored ventilation and respiratory function stable Cardiovascular status: blood pressure returned to baseline and stable Postop Assessment: no apparent nausea or vomiting Anesthetic complications: no     Last Vitals:  Vitals:   09/29/18 1419 09/29/18 1429  BP: 116/84   Pulse: 63 64  Resp: 12 13  Temp:    SpO2: 100% 100%    Last Pain:  Vitals:   09/29/18 1429  TempSrc:   PainSc: 0-No pain                 Alphonsus Sias

## 2018-09-29 NOTE — Transfer of Care (Signed)
Immediate Anesthesia Transfer of Care Note  Patient: Brittney Campos  Procedure(s) Performed: COLONOSCOPY WITH PROPOFOL (N/A )  Patient Location: PACU and Endoscopy Unit  Anesthesia Type:General  Level of Consciousness: sedated  Airway & Oxygen Therapy: Patient Spontanous Breathing  Post-op Assessment: Report given to RN  Post vital signs: stable  Last Vitals:  Vitals Value Taken Time  BP 135/55 09/29/2018  2:01 PM  Temp 36 C 09/29/2018  1:59 PM  Pulse 77 09/29/2018  2:02 PM  Resp 16 09/29/2018  2:02 PM  SpO2 100 % 09/29/2018  2:02 PM  Vitals shown include unvalidated device data.  Last Pain:  Vitals:   09/29/18 1359  TempSrc: Tympanic  PainSc: Asleep         Complications: No apparent anesthesia complications

## 2018-09-30 ENCOUNTER — Encounter: Payer: Self-pay | Admitting: Gastroenterology

## 2018-10-03 LAB — SURGICAL PATHOLOGY

## 2018-11-14 DIAGNOSIS — E519 Thiamine deficiency, unspecified: Secondary | ICD-10-CM | POA: Diagnosis not present

## 2018-11-14 DIAGNOSIS — R4789 Other speech disturbances: Secondary | ICD-10-CM | POA: Diagnosis not present

## 2018-11-14 DIAGNOSIS — G43019 Migraine without aura, intractable, without status migrainosus: Secondary | ICD-10-CM | POA: Diagnosis not present

## 2018-11-14 DIAGNOSIS — E538 Deficiency of other specified B group vitamins: Secondary | ICD-10-CM | POA: Diagnosis not present

## 2018-11-14 DIAGNOSIS — Z638 Other specified problems related to primary support group: Secondary | ICD-10-CM | POA: Diagnosis not present

## 2018-11-14 DIAGNOSIS — G4700 Insomnia, unspecified: Secondary | ICD-10-CM | POA: Diagnosis not present

## 2019-02-20 DIAGNOSIS — E782 Mixed hyperlipidemia: Secondary | ICD-10-CM | POA: Diagnosis not present

## 2019-02-20 DIAGNOSIS — Z1159 Encounter for screening for other viral diseases: Secondary | ICD-10-CM | POA: Diagnosis not present

## 2019-02-20 DIAGNOSIS — M810 Age-related osteoporosis without current pathological fracture: Secondary | ICD-10-CM | POA: Diagnosis not present

## 2019-02-20 DIAGNOSIS — R7303 Prediabetes: Secondary | ICD-10-CM | POA: Diagnosis not present

## 2019-03-03 DIAGNOSIS — M810 Age-related osteoporosis without current pathological fracture: Secondary | ICD-10-CM | POA: Diagnosis not present

## 2019-03-03 DIAGNOSIS — R7302 Impaired glucose tolerance (oral): Secondary | ICD-10-CM | POA: Diagnosis not present

## 2019-04-05 DIAGNOSIS — I059 Rheumatic mitral valve disease, unspecified: Secondary | ICD-10-CM | POA: Diagnosis not present

## 2019-04-05 DIAGNOSIS — E782 Mixed hyperlipidemia: Secondary | ICD-10-CM | POA: Diagnosis not present

## 2019-04-05 DIAGNOSIS — R0789 Other chest pain: Secondary | ICD-10-CM | POA: Diagnosis not present

## 2019-04-17 DIAGNOSIS — Z0000 Encounter for general adult medical examination without abnormal findings: Secondary | ICD-10-CM | POA: Diagnosis not present

## 2019-04-17 DIAGNOSIS — R7303 Prediabetes: Secondary | ICD-10-CM | POA: Diagnosis not present

## 2019-04-17 DIAGNOSIS — G43009 Migraine without aura, not intractable, without status migrainosus: Secondary | ICD-10-CM | POA: Diagnosis not present

## 2019-04-17 DIAGNOSIS — E782 Mixed hyperlipidemia: Secondary | ICD-10-CM | POA: Diagnosis not present

## 2019-04-17 DIAGNOSIS — M810 Age-related osteoporosis without current pathological fracture: Secondary | ICD-10-CM | POA: Diagnosis not present

## 2019-05-17 DIAGNOSIS — R4789 Other speech disturbances: Secondary | ICD-10-CM | POA: Diagnosis not present

## 2019-05-17 DIAGNOSIS — G43019 Migraine without aura, intractable, without status migrainosus: Secondary | ICD-10-CM | POA: Diagnosis not present

## 2019-05-18 DIAGNOSIS — M810 Age-related osteoporosis without current pathological fracture: Secondary | ICD-10-CM | POA: Diagnosis not present

## 2019-06-19 DIAGNOSIS — R079 Chest pain, unspecified: Secondary | ICD-10-CM | POA: Diagnosis not present

## 2019-06-19 DIAGNOSIS — R0789 Other chest pain: Secondary | ICD-10-CM | POA: Diagnosis not present

## 2019-06-20 DIAGNOSIS — R339 Retention of urine, unspecified: Secondary | ICD-10-CM | POA: Diagnosis not present

## 2019-06-20 DIAGNOSIS — R0789 Other chest pain: Secondary | ICD-10-CM | POA: Diagnosis not present

## 2019-06-20 DIAGNOSIS — E782 Mixed hyperlipidemia: Secondary | ICD-10-CM | POA: Diagnosis not present

## 2019-06-20 DIAGNOSIS — I059 Rheumatic mitral valve disease, unspecified: Secondary | ICD-10-CM | POA: Diagnosis not present

## 2019-07-05 DIAGNOSIS — I059 Rheumatic mitral valve disease, unspecified: Secondary | ICD-10-CM | POA: Diagnosis not present

## 2019-08-25 DIAGNOSIS — R7303 Prediabetes: Secondary | ICD-10-CM | POA: Diagnosis not present

## 2019-08-25 DIAGNOSIS — E782 Mixed hyperlipidemia: Secondary | ICD-10-CM | POA: Diagnosis not present

## 2019-08-25 DIAGNOSIS — M810 Age-related osteoporosis without current pathological fracture: Secondary | ICD-10-CM | POA: Diagnosis not present

## 2019-08-29 DIAGNOSIS — G43019 Migraine without aura, intractable, without status migrainosus: Secondary | ICD-10-CM | POA: Diagnosis not present

## 2019-08-29 DIAGNOSIS — G4700 Insomnia, unspecified: Secondary | ICD-10-CM | POA: Diagnosis not present

## 2019-08-29 DIAGNOSIS — R4789 Other speech disturbances: Secondary | ICD-10-CM | POA: Diagnosis not present

## 2019-08-31 DIAGNOSIS — Z0000 Encounter for general adult medical examination without abnormal findings: Secondary | ICD-10-CM | POA: Diagnosis not present

## 2019-08-31 DIAGNOSIS — G43009 Migraine without aura, not intractable, without status migrainosus: Secondary | ICD-10-CM | POA: Diagnosis not present

## 2019-08-31 DIAGNOSIS — Z1231 Encounter for screening mammogram for malignant neoplasm of breast: Secondary | ICD-10-CM | POA: Diagnosis not present

## 2019-08-31 DIAGNOSIS — M810 Age-related osteoporosis without current pathological fracture: Secondary | ICD-10-CM | POA: Diagnosis not present

## 2019-08-31 DIAGNOSIS — R7303 Prediabetes: Secondary | ICD-10-CM | POA: Diagnosis not present

## 2019-08-31 DIAGNOSIS — E782 Mixed hyperlipidemia: Secondary | ICD-10-CM | POA: Diagnosis not present

## 2019-09-13 DIAGNOSIS — R7302 Impaired glucose tolerance (oral): Secondary | ICD-10-CM | POA: Diagnosis not present

## 2019-09-13 DIAGNOSIS — M810 Age-related osteoporosis without current pathological fracture: Secondary | ICD-10-CM | POA: Diagnosis not present

## 2019-10-09 DIAGNOSIS — R0789 Other chest pain: Secondary | ICD-10-CM | POA: Diagnosis not present

## 2019-10-09 DIAGNOSIS — I059 Rheumatic mitral valve disease, unspecified: Secondary | ICD-10-CM | POA: Diagnosis not present

## 2019-10-09 DIAGNOSIS — E782 Mixed hyperlipidemia: Secondary | ICD-10-CM | POA: Diagnosis not present

## 2019-11-21 DIAGNOSIS — M810 Age-related osteoporosis without current pathological fracture: Secondary | ICD-10-CM | POA: Diagnosis not present

## 2020-01-02 DIAGNOSIS — Z20822 Contact with and (suspected) exposure to covid-19: Secondary | ICD-10-CM | POA: Diagnosis not present

## 2020-01-02 DIAGNOSIS — R6889 Other general symptoms and signs: Secondary | ICD-10-CM | POA: Diagnosis not present

## 2020-01-02 DIAGNOSIS — R197 Diarrhea, unspecified: Secondary | ICD-10-CM | POA: Diagnosis not present

## 2020-01-03 DIAGNOSIS — R197 Diarrhea, unspecified: Secondary | ICD-10-CM | POA: Diagnosis not present

## 2020-01-03 DIAGNOSIS — R110 Nausea: Secondary | ICD-10-CM | POA: Diagnosis not present

## 2020-01-03 DIAGNOSIS — J208 Acute bronchitis due to other specified organisms: Secondary | ICD-10-CM | POA: Diagnosis not present

## 2020-01-03 DIAGNOSIS — U071 COVID-19: Secondary | ICD-10-CM | POA: Diagnosis not present

## 2020-01-05 DIAGNOSIS — J208 Acute bronchitis due to other specified organisms: Secondary | ICD-10-CM | POA: Diagnosis not present

## 2020-01-05 DIAGNOSIS — U071 COVID-19: Secondary | ICD-10-CM | POA: Diagnosis not present

## 2020-01-08 DIAGNOSIS — U071 COVID-19: Secondary | ICD-10-CM | POA: Diagnosis not present

## 2020-01-08 DIAGNOSIS — J208 Acute bronchitis due to other specified organisms: Secondary | ICD-10-CM | POA: Diagnosis not present

## 2020-01-26 DIAGNOSIS — U071 COVID-19: Secondary | ICD-10-CM | POA: Diagnosis not present

## 2020-01-26 DIAGNOSIS — H8113 Benign paroxysmal vertigo, bilateral: Secondary | ICD-10-CM | POA: Diagnosis not present

## 2020-01-26 DIAGNOSIS — J208 Acute bronchitis due to other specified organisms: Secondary | ICD-10-CM | POA: Diagnosis not present

## 2020-02-22 DIAGNOSIS — M810 Age-related osteoporosis without current pathological fracture: Secondary | ICD-10-CM | POA: Diagnosis not present

## 2020-02-22 DIAGNOSIS — E782 Mixed hyperlipidemia: Secondary | ICD-10-CM | POA: Diagnosis not present

## 2020-02-22 DIAGNOSIS — R7303 Prediabetes: Secondary | ICD-10-CM | POA: Diagnosis not present

## 2020-02-29 DIAGNOSIS — M810 Age-related osteoporosis without current pathological fracture: Secondary | ICD-10-CM | POA: Diagnosis not present

## 2020-02-29 DIAGNOSIS — R7303 Prediabetes: Secondary | ICD-10-CM | POA: Diagnosis not present

## 2020-02-29 DIAGNOSIS — G43009 Migraine without aura, not intractable, without status migrainosus: Secondary | ICD-10-CM | POA: Diagnosis not present

## 2020-02-29 DIAGNOSIS — E782 Mixed hyperlipidemia: Secondary | ICD-10-CM | POA: Diagnosis not present

## 2020-02-29 DIAGNOSIS — Z0000 Encounter for general adult medical examination without abnormal findings: Secondary | ICD-10-CM | POA: Diagnosis not present

## 2020-02-29 DIAGNOSIS — H8110 Benign paroxysmal vertigo, unspecified ear: Secondary | ICD-10-CM | POA: Diagnosis not present

## 2020-03-19 DIAGNOSIS — K2100 Gastro-esophageal reflux disease with esophagitis, without bleeding: Secondary | ICD-10-CM | POA: Diagnosis not present

## 2020-03-19 DIAGNOSIS — K5900 Constipation, unspecified: Secondary | ICD-10-CM | POA: Diagnosis not present

## 2020-03-29 IMAGING — CR DG ANKLE COMPLETE 3+V*R*
3 series · 3 of 3 positions shown · non-contrast
Comparison: None.

CLINICAL DATA: Ankle injury 2 weeks ago, pain and swelling lateral
ankle and Achilles area of heel.

EXAM:
RIGHT ANKLE - COMPLETE 3+ VIEW

[ankle ap]
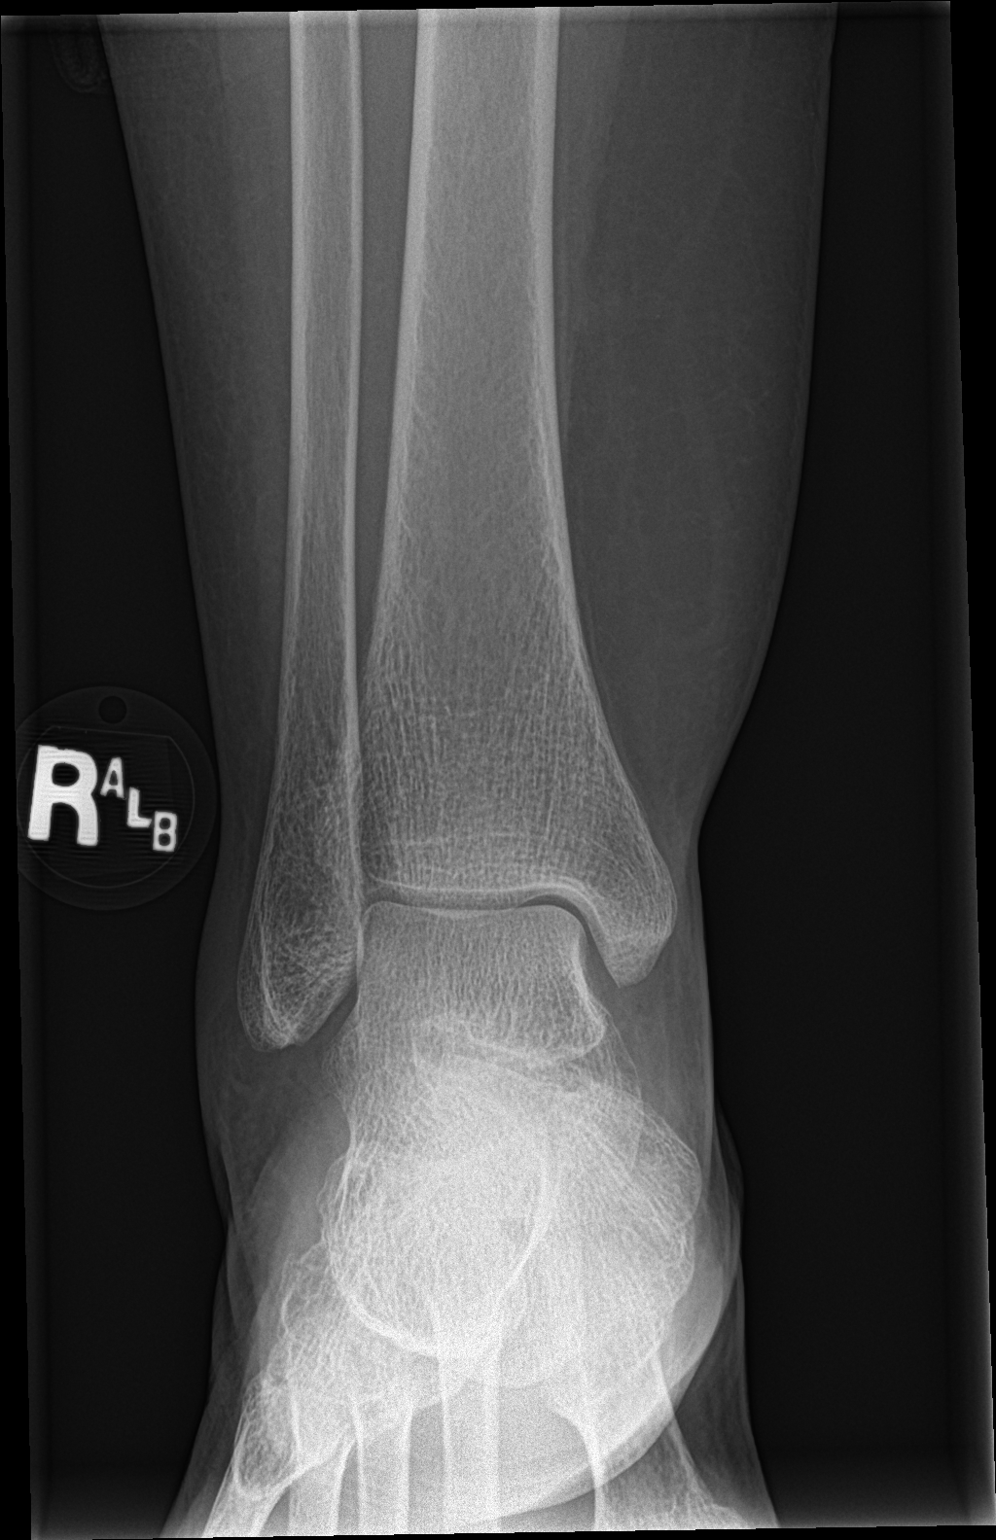

[ankle obl]
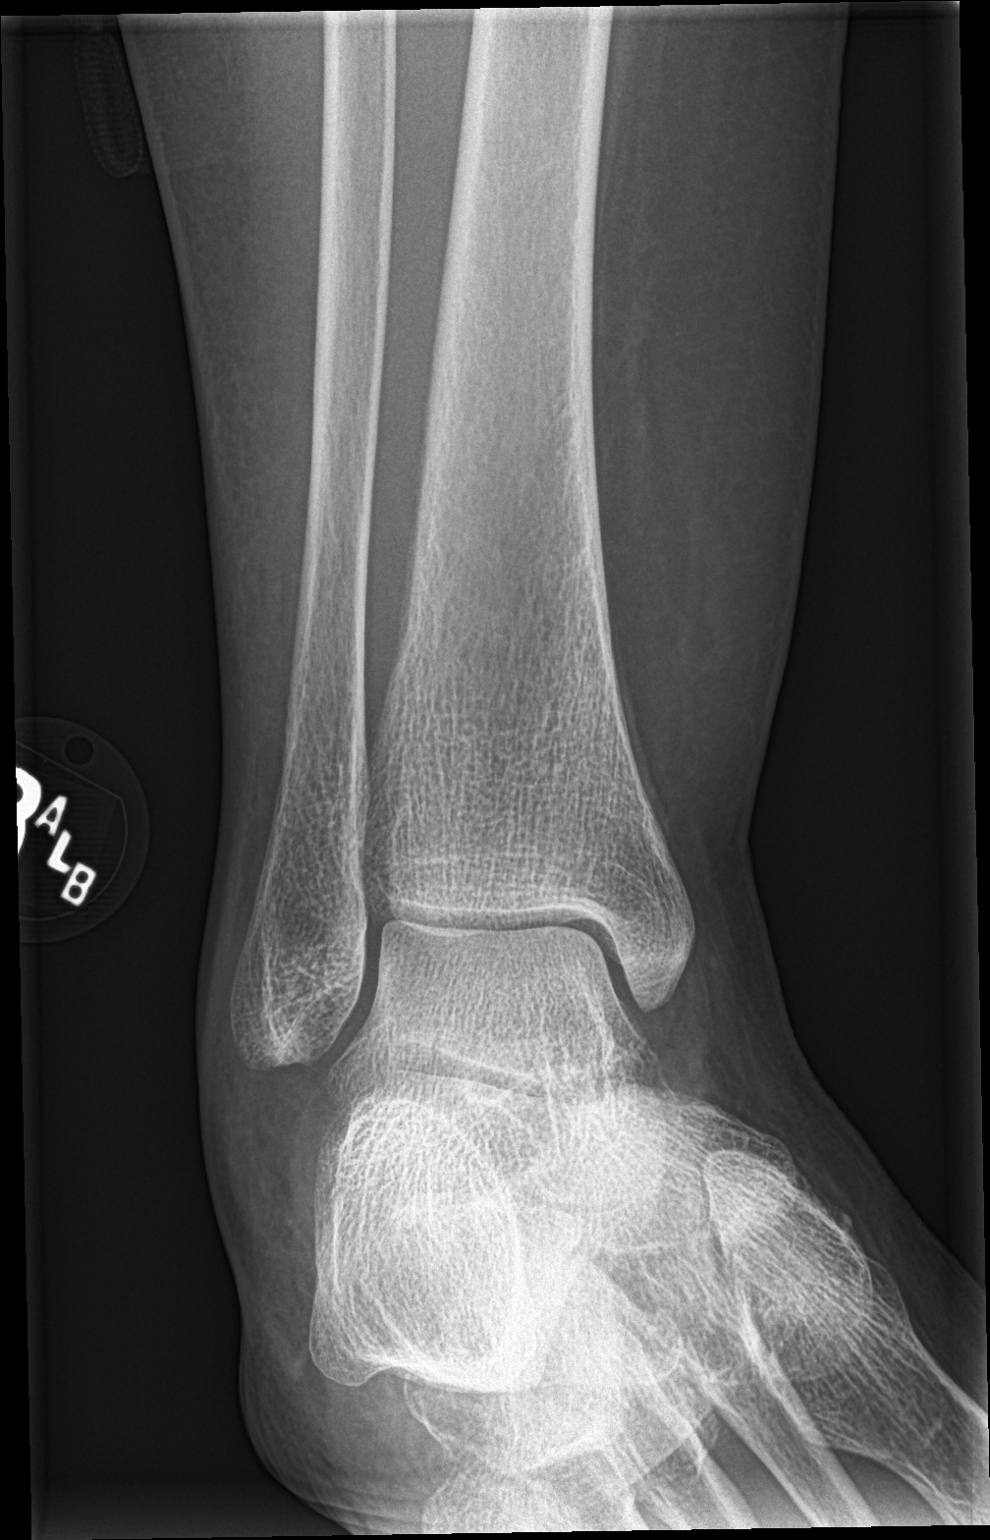

[ankle lat]
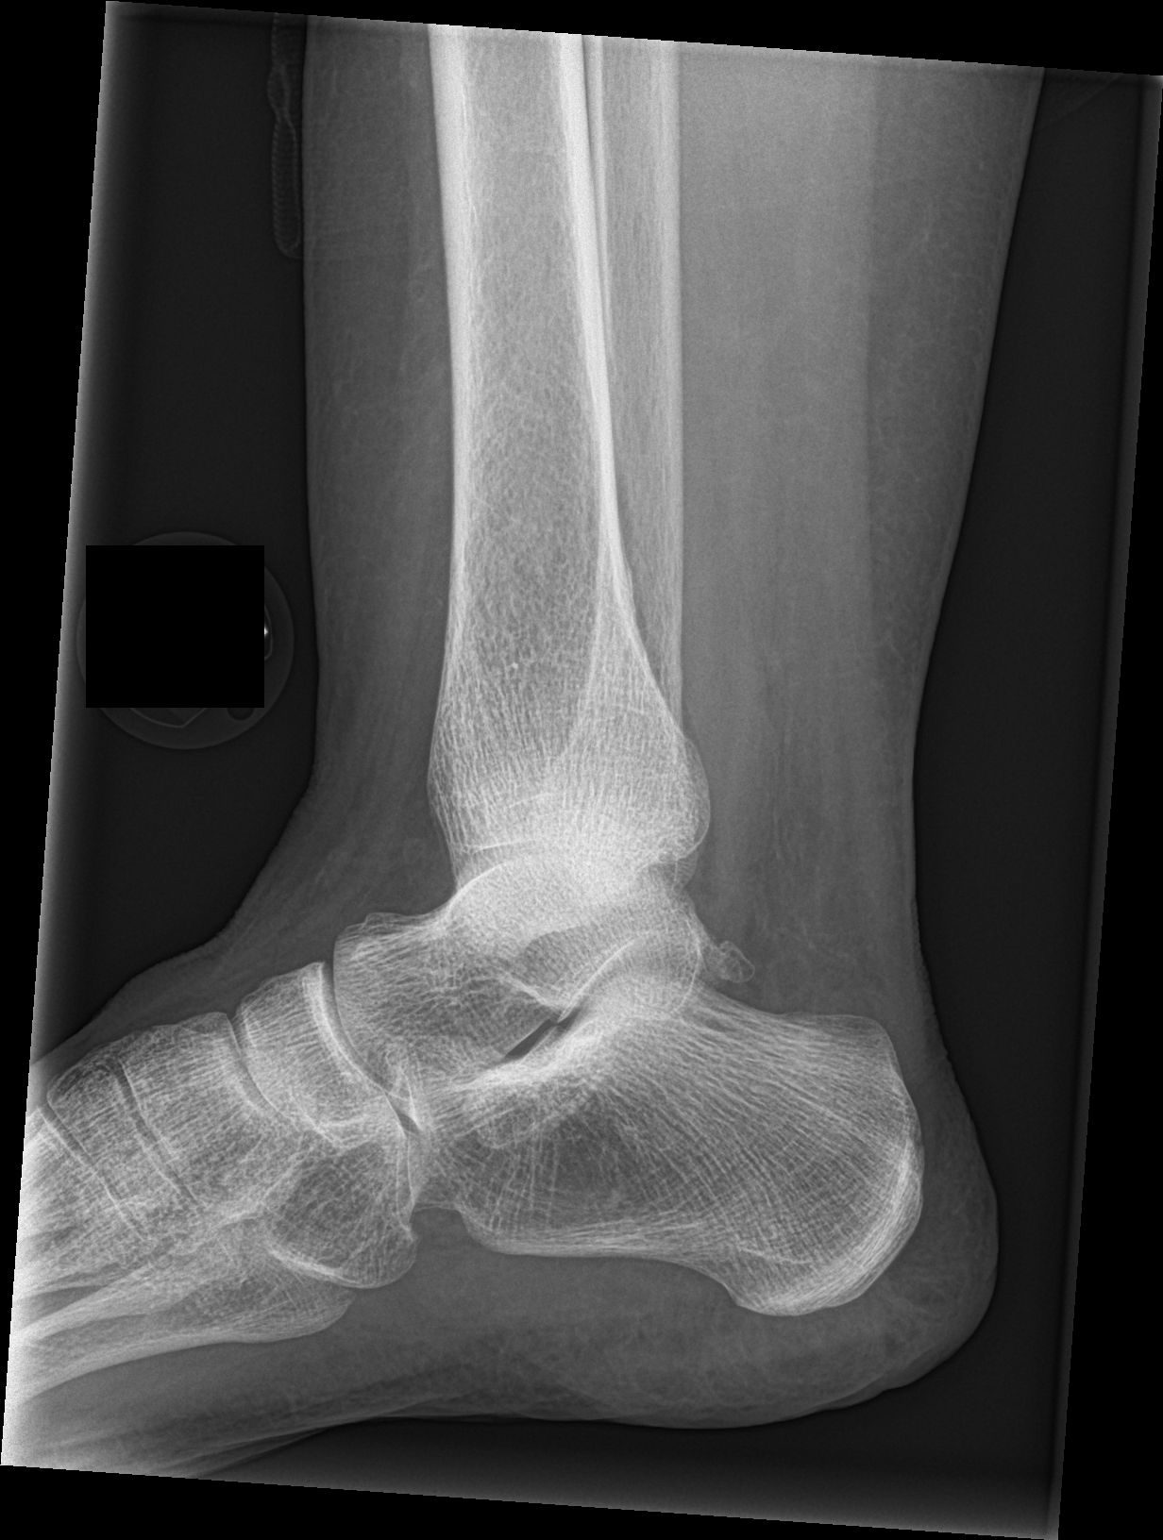

[3 of 3 positions shown; findings below may reference images not displayed]

FINDINGS: Osseous alignment is normal. Ankle mortise is symmetric. No fracture
line or displaced fracture fragment seen. No degenerative change
seen. Visualized portions of the hindfoot and midfoot appear intact
and normally aligned. Soft tissues about the RIGHT ankle are
unremarkable.
IMPRESSION: Negative.

## 2020-04-01 DIAGNOSIS — G43019 Migraine without aura, intractable, without status migrainosus: Secondary | ICD-10-CM | POA: Diagnosis not present

## 2020-04-01 DIAGNOSIS — G4700 Insomnia, unspecified: Secondary | ICD-10-CM | POA: Diagnosis not present

## 2020-04-01 DIAGNOSIS — R4789 Other speech disturbances: Secondary | ICD-10-CM | POA: Diagnosis not present

## 2020-05-11 ENCOUNTER — Encounter: Payer: Self-pay | Admitting: Emergency Medicine

## 2020-05-11 ENCOUNTER — Ambulatory Visit
Admission: EM | Admit: 2020-05-11 | Discharge: 2020-05-11 | Disposition: A | Discharge status: Home / Self Care | Payer: PPO | Attending: Emergency Medicine | Admitting: Emergency Medicine

## 2020-05-11 ENCOUNTER — Other Ambulatory Visit: Payer: Self-pay

## 2020-05-11 DIAGNOSIS — R8290 Unspecified abnormal findings in urine: Secondary | ICD-10-CM

## 2020-05-11 DIAGNOSIS — R102 Pelvic and perineal pain: Secondary | ICD-10-CM

## 2020-05-11 DIAGNOSIS — R109 Unspecified abdominal pain: Secondary | ICD-10-CM | POA: Diagnosis not present

## 2020-05-11 LAB — URINALYSIS, COMPLETE (UACMP) WITH MICROSCOPIC
Bilirubin Urine: NEGATIVE
Glucose, UA: 100 mg/dL — AB
Hgb urine dipstick: NEGATIVE
Ketones, ur: NEGATIVE mg/dL
Nitrite: POSITIVE — AB
Protein, ur: NEGATIVE mg/dL
Specific Gravity, Urine: 1.015 (ref 1.005–1.030)
pH: 6 (ref 5.0–8.0)

## 2020-05-11 MED ORDER — CIPROFLOXACIN HCL 500 MG PO TABS: 500.0000 mg | ORAL_TABLET | Freq: Two times a day (BID) | ORAL | 0 refills | Status: AC

## 2020-05-11 NOTE — Discharge Instructions (Addendum)
Recommend start Cipro 500mg  twice a day as directed. Continue to push fluids. Follow-up pending urine culture results.

## 2020-05-11 NOTE — ED Triage Notes (Signed)
Patient c/o lower back pain and lower pelvic pain that started 2 days ago.  Patient also reports a strong odor from her urine.  Patient states that she has been taking OTC Azo.

## 2020-05-12 NOTE — ED Provider Notes (Signed)
[facility]    CSN: [id number] Arrival date & time: 05/11/20  1134      History   Chief Complaint Chief Complaint  Patient presents with  . Back Pain  . Abdominal Pain    HPI Brittney Campos is a 69 y.o. female.   69 year old female presents with lower pelvic discomfort and lower right back pain for the past 2 days.  Started with her noticing a strong odor of her urine for the past week.  Denies any distinct dysuria or blood in her urine.  Also denies any fever or unusual vaginal discharge.  Has been taking OTC AZO with some relief.  Has history of chronic UTIs in the past and was on low-dose Macrobid daily but this has been over 10 years ago.  Just started Linzess about 2 months ago and concerned over possible association of medication with new UTI symptoms.  Other chronic health issues include GERD, migraine headache, osteoporosis, Sciatica and anxiety. Currently takes Toprol XL, Protonix, aspirin, and Linzess daily. Emgality monthly and Prolia every 6 months. Also takes Flexeril, Imitrex, Phenergan and Xanax prn.   The history is provided by the patient.    Past Medical History:  Diagnosis Date  . Bladder infection, chronic   . GERD (gastroesophageal reflux disease)   . Headache   . Migraine   . Mitral valve disorder   . Sciatica   . Senile osteoporosis     There are no problems to display for this patient.   Past Surgical History:  Procedure Laterality Date  . ABDOMINAL HYSTERECTOMY    . BREAST EXCISIONAL BIOPSY Left 08/21/04   neg  . COLONOSCOPY W/ POLYPECTOMY    . COLONOSCOPY WITH PROPOFOL N/A 09/29/2018   Procedure: COLONOSCOPY WITH PROPOFOL;  Surgeon: Lollie Sails, MD;  Location: [facility] ENDOSCOPY;  Service: Endoscopy;  Laterality: N/A;  . ESOPHAGOGASTRODUODENOSCOPY (EGD) WITH PROPOFOL N/A 01/26/2017   Procedure: ESOPHAGOGASTRODUODENOSCOPY (EGD) WITH PROPOFOL;  Surgeon: Lollie Sails, MD;  Location: [facility] ENDOSCOPY;  Service: Endoscopy;   Laterality: N/A;  . FL INJ RT SHOULDER CT ARTHROGRAM ([facility] HX)    . INCONTINENCE SURGERY    . LEFT HEART CATH AND CORONARY ANGIOGRAPHY Left 04/05/2018   Procedure: LEFT HEART CATH AND CORONARY ANGIOGRAPHY;  Surgeon: Teodoro Spray, MD;  Location: [facility];  Service: Cardiovascular;  Laterality: Left;    OB History   No obstetric history on file.      Home Medications    Prior to Admission medications   Medication Sig Start Date End Date Taking? Authorizing Provider  ALPRAZolam (XANAX) 0.25 MG tablet Take 0.25 mg by mouth 3 (three) times daily as needed for anxiety.   Yes [provider]  aspirin EC 81 MG tablet Take 81 mg by mouth daily.   Yes [provider]  calcium carbonate (OSCAL) 1500 (600 Ca) MG TABS tablet Take 600 mg of elemental calcium by mouth daily. Plus Vit D3   Yes [provider]  cyclobenzaprine (FLEXERIL) 5 MG tablet Take 5 mg by mouth 2 (two) times daily as needed for muscle spasms.   Yes [provider]  denosumab (PROLIA) 60 MG/ML SOSY injection Inject into the skin.   Yes [provider]  Galcanezumab-gnlm (EMGALITY) 120 MG/ML SOAJ Inject 120 mg into the skin every 30 (thirty) days.   Yes [provider]  linaclotide Rolan Lipa) 72 MCG capsule Take by mouth. 03/19/20  Yes [provider]  metoprolol succinate (TOPROL-XL) 25 MG  24 hr tablet Take by mouth. 04/29/18 05/11/20 Yes [provider]  Multiple Vitamin (MULTIVITAMIN) tablet Take 1 tablet by mouth daily.   Yes [provider]  pantoprazole (PROTONIX) 40 MG tablet Take 40 mg by mouth daily.   Yes [provider]  promethazine (PHENERGAN) 25 MG tablet Take 25 mg by mouth every 8 (eight) hours as needed for nausea or vomiting.    Yes [provider]  SUMAtriptan (IMITREX) 100 MG tablet Take 100 mg by mouth every 2 (two) hours as needed for migraine. May repeat in 2 hours if headache persists or recurs.   Yes  [provider]  ciprofloxacin (CIPRO) 500 MG tablet Take 1 tablet (500 mg total) by mouth every 12 (twelve) hours for 5 days. 05/11/20 05/16/20  Katy Apo, NP    Family History Family History  Problem Relation Age of Onset  . Breast cancer Neg Hx     Social History Social History   Tobacco Use  . Smoking status: Never Smoker  . Smokeless tobacco: Never Used  Vaping Use  . Vaping Use: Never used  Substance Use Topics  . Alcohol use: No  . Drug use: No     Allergies   Biaxin [clarithromycin], Fosamax [alendronate sodium], Macrobid [nitrofurantoin macrocrystal], Prednisone, Sulfamethoxazole-trimethoprim, and Venlafaxine   Review of Systems Review of Systems  Constitutional: Negative for appetite change, chills, fatigue and fever.  Gastrointestinal: Positive for abdominal pain. Negative for nausea and vomiting.  Genitourinary: Positive for flank pain (mainly right side) and frequency. Negative for decreased urine volume, difficulty urinating, dysuria, hematuria, urgency, vaginal bleeding, vaginal discharge and vaginal pain.  Musculoskeletal: Positive for arthralgias and back pain. Negative for myalgias.  Skin: Negative for color change, rash and wound.  Allergic/Immunologic: Negative for environmental allergies.  Neurological: Positive for headaches. Negative for dizziness, seizures, syncope, weakness, light-headedness and numbness.  Hematological: Negative for adenopathy. Does not bruise/bleed easily.     Physical Exam Triage Vital Signs ED Triage Vitals  Enc Vitals Group     BP 05/11/20 1214 (!) 142/78     Pulse Rate 05/11/20 1214 60     Resp 05/11/20 1214 14     Temp 05/11/20 1214 98.1 F (36.7 C)     Temp Source 05/11/20 1214 Oral     SpO2 05/11/20 1214 98 %     Weight 05/11/20 1211 172 lb (78 kg)     Height 05/11/20 1211 5\' 5"  (1.651 m)     Head Circumference --      Peak Flow --      Pain Score 05/11/20 1211 6     Pain Loc --      Pain Edu?  --      Excl. in [place]? --    No data found.  Updated Vital Signs BP (!) 142/78 (BP Location: Right Arm)   Pulse 60   Temp 98.1 F (36.7 C) (Oral)   Resp 14   Ht 5\' 5"  (1.651 m)   Wt 172 lb (78 kg)   SpO2 98%   BMI 28.62 kg/m   Visual Acuity Right Eye Distance:   Left Eye Distance:   Bilateral Distance:    Right Eye Near:   Left Eye Near:    Bilateral Near:     Physical Exam Vitals and nursing note reviewed.  Constitutional:      General: She is awake. She is not in acute distress.    Appearance: She is well-developed and well-groomed. She is  not ill-appearing.     Comments: She is sitting comfortably in the exam chair in no acute distress.   HENT:     Head: Normocephalic and atraumatic.  Cardiovascular:     Rate and Rhythm: Normal rate.  Pulmonary:     Effort: Pulmonary effort is normal.  Abdominal:     General: Abdomen is flat. Bowel sounds are normal.     Palpations: Abdomen is soft.     Tenderness: There is abdominal tenderness in the suprapubic area. There is no right CVA tenderness, left CVA tenderness or guarding.  Musculoskeletal:        General: Normal range of motion.  Skin:    General: Skin is warm and dry.     Findings: No rash.  Neurological:     General: No focal deficit present.     Mental Status: She is alert and oriented to person, place, and time.  Psychiatric:        Mood and Affect: Mood normal.        Behavior: Behavior normal. Behavior is cooperative.        Thought Content: Thought content normal.        Judgment: Judgment normal.      UC Treatments / Results  Labs (all labs ordered are listed, but only abnormal results are displayed) Labs Reviewed  URINALYSIS, COMPLETE (UACMP) WITH MICROSCOPIC - Abnormal; Notable for the following components:      Result Value   Color, Urine ORANGE (*)    APPearance HAZY (*)    Glucose, UA 100 (*)    Nitrite POSITIVE (*)    Leukocytes,Ua SMALL (*)    Bacteria, UA MANY (*)    All other  components within normal limits  URINE CULTURE    EKG   Radiology No results found.  Procedures Procedures (including critical care time)  Medications Ordered in UC Medications - No data to display  Initial Impression / Assessment and Plan / UC Course  I have reviewed the triage vital signs and the nursing notes.  Pertinent labs & imaging results that were available during my care of the patient were reviewed by me and considered in my medical decision making (see chart for details).    Reviewed urinalysis results with patient- positive WBC's, nitrites and bacteria. Also small glucose- results consistent with UTI. Patient also has reaction to many antibiotics- has taken Cipro in the past without side effects. Will start Cipro 500mg  twice a day for 5 days. Increase fluid consumption. May continue OTC AZO as needed. Urine sent for culture. Recommend follow-up with PCP on glucose in urine. Recommend follow-up pending urine culture results.   Final Clinical Impressions(s) / UC Diagnoses   Final diagnoses:  Suprapubic abdominal pain  Abnormal urine odor  Bilateral flank pain     Discharge Instructions     Recommend start Cipro 500mg  twice a day as directed. Continue to push fluids. Follow-up pending urine culture results.     ED Prescriptions    Medication Sig Dispense Auth. Provider   ciprofloxacin (CIPRO) 500 MG tablet Take 1 tablet (500 mg total) by mouth every 12 (twelve) hours for 5 days. 10 tablet Filippa Yarbough, Nicholes Stairs, NP     PDMP not reviewed this encounter.   Katy Apo, NP 05/12/20 [phone]

## 2020-05-14 LAB — URINE CULTURE: Culture: >=100000 — AB

## 2020-06-12 DIAGNOSIS — M810 Age-related osteoporosis without current pathological fracture: Secondary | ICD-10-CM | POA: Diagnosis not present

## 2020-08-01 ENCOUNTER — Other Ambulatory Visit: Payer: Self-pay | Admitting: Physician Assistant

## 2020-08-01 DIAGNOSIS — Z1231 Encounter for screening mammogram for malignant neoplasm of breast: Secondary | ICD-10-CM

## 2020-08-26 DIAGNOSIS — E782 Mixed hyperlipidemia: Secondary | ICD-10-CM | POA: Diagnosis not present

## 2020-08-26 DIAGNOSIS — R7303 Prediabetes: Secondary | ICD-10-CM | POA: Diagnosis not present

## 2020-08-26 DIAGNOSIS — M810 Age-related osteoporosis without current pathological fracture: Secondary | ICD-10-CM | POA: Diagnosis not present

## 2020-09-09 ENCOUNTER — Ambulatory Visit
Admission: RE | Admit: 2020-09-09 | Discharge: 2020-09-09 | Disposition: A | Discharge status: Home / Self Care | Payer: PPO | Source: Ambulatory Visit | Attending: Physician Assistant | Admitting: Physician Assistant

## 2020-09-09 ENCOUNTER — Other Ambulatory Visit: Payer: Self-pay

## 2020-09-09 DIAGNOSIS — Z1231 Encounter for screening mammogram for malignant neoplasm of breast: Secondary | ICD-10-CM | POA: Diagnosis not present

## 2020-09-10 DIAGNOSIS — K5909 Other constipation: Secondary | ICD-10-CM | POA: Diagnosis not present

## 2020-09-10 DIAGNOSIS — K2100 Gastro-esophageal reflux disease with esophagitis, without bleeding: Secondary | ICD-10-CM | POA: Diagnosis not present

## 2020-10-11 DIAGNOSIS — I059 Rheumatic mitral valve disease, unspecified: Secondary | ICD-10-CM | POA: Diagnosis not present

## 2020-10-11 DIAGNOSIS — E782 Mixed hyperlipidemia: Secondary | ICD-10-CM | POA: Diagnosis not present

## 2020-10-11 DIAGNOSIS — R42 Dizziness and giddiness: Secondary | ICD-10-CM | POA: Diagnosis not present

## 2020-10-11 DIAGNOSIS — R7303 Prediabetes: Secondary | ICD-10-CM | POA: Diagnosis not present

## 2021-04-01 ENCOUNTER — Other Ambulatory Visit: Payer: Self-pay

## 2021-04-01 ENCOUNTER — Encounter: Payer: Self-pay | Admitting: Emergency Medicine

## 2021-04-01 ENCOUNTER — Emergency Department: Payer: Medicare Other

## 2021-04-01 ENCOUNTER — Emergency Department
Admission: EM | Admit: 2021-04-01 | Discharge: 2021-04-01 | Disposition: A | Discharge status: Home / Self Care | Payer: Medicare Other | Attending: Emergency Medicine | Admitting: Emergency Medicine

## 2021-04-01 DIAGNOSIS — S62643A Nondisplaced fracture of proximal phalanx of left middle finger, initial encounter for closed fracture: Secondary | ICD-10-CM | POA: Insufficient documentation

## 2021-04-01 DIAGNOSIS — Y92410 Unspecified street and highway as the place of occurrence of the external cause: Secondary | ICD-10-CM | POA: Diagnosis not present

## 2021-04-01 DIAGNOSIS — S60943A Unspecified superficial injury of left middle finger, initial encounter: Secondary | ICD-10-CM | POA: Diagnosis present

## 2021-04-01 DIAGNOSIS — S60042A Contusion of left ring finger without damage to nail, initial encounter: Secondary | ICD-10-CM | POA: Insufficient documentation

## 2021-04-01 DIAGNOSIS — Z7982 Long term (current) use of aspirin: Secondary | ICD-10-CM | POA: Diagnosis not present

## 2021-04-01 MED ORDER — HYDROCODONE-ACETAMINOPHEN 5-325 MG PO TABS: 1.0000 | ORAL_TABLET | Freq: Four times a day (QID) | ORAL | 0 refills | Status: DC | PRN

## 2021-04-01 MED ORDER — HYDROCODONE-ACETAMINOPHEN 5-325 MG PO TABS
1.0000 | ORAL_TABLET | Freq: Once | ORAL | Status: AC
  Administered 2021-04-01: 1 via ORAL
  Filled 2021-04-01: qty 1
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
## 2021-06-11 ENCOUNTER — Other Ambulatory Visit: Payer: Self-pay | Admitting: Physician Assistant

## 2021-06-11 DIAGNOSIS — Z1231 Encounter for screening mammogram for malignant neoplasm of breast: Secondary | ICD-10-CM

## 2021-09-16 ENCOUNTER — Ambulatory Visit
Admission: EM | Admit: 2021-09-16 | Discharge: 2021-09-16 | Disposition: A | Discharge status: Home / Self Care | Payer: Medicare Other | Attending: Family | Admitting: Family

## 2021-09-16 ENCOUNTER — Other Ambulatory Visit: Payer: Self-pay

## 2021-09-16 ENCOUNTER — Encounter: Payer: Self-pay | Admitting: Emergency Medicine

## 2021-09-16 DIAGNOSIS — R062 Wheezing: Secondary | ICD-10-CM | POA: Diagnosis not present

## 2021-09-16 DIAGNOSIS — R051 Acute cough: Secondary | ICD-10-CM

## 2021-09-16 DIAGNOSIS — R52 Pain, unspecified: Secondary | ICD-10-CM | POA: Diagnosis not present

## 2021-09-16 MED ORDER — BENZONATATE 100 MG PO CAPS: 100.0000 mg | ORAL_CAPSULE | Freq: Three times a day (TID) | ORAL | 0 refills | Status: AC | PRN

## 2021-09-16 MED ORDER — ALBUTEROL SULFATE HFA 108 (90 BASE) MCG/ACT IN AERS: 2.0000 | INHALATION_SPRAY | Freq: Four times a day (QID) | RESPIRATORY_TRACT | 0 refills | Status: AC | PRN

## 2021-09-16 NOTE — Discharge Instructions (Addendum)
Recommend use Tessalon cough pills 1 every 8 hours as needed. May use Albuterol inhaler 2 puffs every 6 hours as needed for cough/wheezing. Continue to push fluids to help loosen up mucus in chest. Follow-up in 4 to 5 days if not improving.

## 2021-09-16 NOTE — ED Triage Notes (Signed)
Pt presents today with c/o of cough with body aches/chills x 1 week. +low grade temp.

## 2021-09-17 NOTE — ED Provider Notes (Signed)
[facility]    CSN: [id number] Arrival date & time: 09/16/21  1238      History   Chief Complaint Chief Complaint  Patient presents with   Cough   Chills    HPI Brittney Campos is a 70 y.o. female.   70 year old female presents with body aches, chills, irritated throat, and mostly dry cough for the past week. Denies any distinct fever, nasal congestion or vomiting. Also having some nausea and decreased appetite. Has taken Mucinex with minimal relief. Has had COVID 19 in the past and symptoms feel different. Other chronic health issues include migraine headaches, osteoporosis, sciatica and GERD. Currently on Toprol XL, Linzess, aspirin and Protonix daily, Emgality once a month, Prolia every 6 months and Flexeril, Xanax, Phenergan and Imitrex prn.    The history is provided by the patient.   Past Medical History:  Diagnosis Date   Bladder infection, chronic    GERD (gastroesophageal reflux disease)    Headache    Migraine    Mitral valve disorder    Sciatica    Senile osteoporosis     There are no problems to display for this patient.   Past Surgical History:  Procedure Laterality Date   ABDOMINAL HYSTERECTOMY     BREAST EXCISIONAL BIOPSY Left 08/21/04   neg   COLONOSCOPY W/ POLYPECTOMY     COLONOSCOPY WITH PROPOFOL N/A 09/29/2018   Procedure: COLONOSCOPY WITH PROPOFOL;  Surgeon: Lollie Sails, MD;  Location: [facility] ENDOSCOPY;  Service: Endoscopy;  Laterality: N/A;   ESOPHAGOGASTRODUODENOSCOPY (EGD) WITH PROPOFOL N/A 01/26/2017   Procedure: ESOPHAGOGASTRODUODENOSCOPY (EGD) WITH PROPOFOL;  Surgeon: Lollie Sails, MD;  Location: [facility] ENDOSCOPY;  Service: Endoscopy;  Laterality: N/A;   FL INJ RT SHOULDER CT ARTHROGRAM ([facility] HX)     INCONTINENCE SURGERY     LEFT HEART CATH AND CORONARY ANGIOGRAPHY Left 04/05/2018   Procedure: LEFT HEART CATH AND CORONARY ANGIOGRAPHY;  Surgeon: Teodoro Spray, MD;  Location: [facility] LAB;  Service:  Cardiovascular;  Laterality: Left;    OB History   No obstetric history on file.      Home Medications    Prior to Admission medications   Medication Sig Start Date End Date Taking? Authorizing Provider  albuterol (VENTOLIN HFA) 108 (90 Base) MCG/ACT inhaler Inhale 2 puffs into the lungs every 6 (six) hours as needed for wheezing (or cough). 09/16/21  Yes Astin Sayre, Nicholes Stairs, NP  benzonatate (TESSALON PERLES) 100 MG capsule Take 1 capsule (100 mg total) by mouth every 8 (eight) hours as needed for cough. 09/16/21  Yes Umaiza Matusik, Nicholes Stairs, NP  ALPRAZolam Duanne Moron) 0.25 MG tablet Take 0.25 mg by mouth 3 (three) times daily as needed for anxiety.    [provider]  aspirin EC 81 MG tablet Take 81 mg by mouth daily.    [provider]  calcium carbonate (OSCAL) 1500 (600 Ca) MG TABS tablet Take 600 mg of elemental calcium by mouth daily. Plus Vit D3    [provider]  cyclobenzaprine (FLEXERIL) 5 MG tablet Take 5 mg by mouth 2 (two) times daily as needed for muscle spasms.    [provider]  denosumab (PROLIA) 60 MG/ML SOSY injection Inject into the skin.    [provider]  Galcanezumab-gnlm (EMGALITY) 120 MG/ML SOAJ Inject 120 mg into the skin every 30 (thirty) days.    [provider]  linaclotide Rolan Lipa) 72 MCG capsule Take by mouth. 03/19/20   [provider]  metoprolol succinate (TOPROL-XL) 25 MG 24 hr tablet Take by mouth. 04/29/18 05/11/20  [provider]  Multiple Vitamin (MULTIVITAMIN) tablet Take 1 tablet by mouth daily.    [provider]  pantoprazole (PROTONIX) 40 MG tablet Take 40 mg by mouth daily.    [provider]  promethazine (PHENERGAN) 25 MG tablet Take 25 mg by mouth every 8 (eight) hours as needed for nausea or vomiting.     [provider]  SUMAtriptan (IMITREX) 100 MG tablet Take 100 mg by mouth every 2 (two) hours as needed for migraine. May repeat in 2 hours if headache  persists or recurs.    [provider]    Family History Family History  Problem Relation Age of Onset   Breast cancer Neg Hx     Social History Social History   Tobacco Use   Smoking status: Never   Smokeless tobacco: Never  Vaping Use   Vaping Use: Never used  Substance Use Topics   Alcohol use: No   Drug use: No     Allergies   Biaxin [clarithromycin], Fosamax [alendronate sodium], Macrobid [nitrofurantoin macrocrystal], Prednisone, Sulfamethoxazole-trimethoprim, and Venlafaxine   Review of Systems Review of Systems  Constitutional:  Positive for appetite change, chills and fatigue. Negative for diaphoresis and fever.  HENT:  Positive for congestion (chest) and sore throat (irritated). Negative for ear discharge, ear pain, facial swelling, mouth sores, nosebleeds, rhinorrhea, sinus pressure, sinus pain and trouble swallowing.   Eyes:  Negative for pain, discharge, redness and itching.  Respiratory:  Positive for cough. Negative for chest tightness and shortness of breath.   Gastrointestinal:  Positive for nausea. Negative for vomiting.  Musculoskeletal:  Positive for arthralgias and myalgias. Negative for neck pain and neck stiffness.  Skin:  Negative for color change and rash.  Allergic/Immunologic: Negative for food allergies and immunocompromised state.  Neurological:  Positive for headaches. Negative for dizziness, seizures, syncope, weakness and light-headedness.  Hematological:  Negative for adenopathy. Does not bruise/bleed easily.    Physical Exam Triage Vital Signs ED Triage Vitals  Enc Vitals Group     BP 09/16/21 1339 (!) 144/74     Pulse Rate 09/16/21 1339 (!) 59     Resp 09/16/21 1339 18     Temp 09/16/21 1339 98.1 F (36.7 C)     Temp Source 09/16/21 1339 Oral     SpO2 09/16/21 1339 100 %     Weight --      Height --      Head Circumference --      Peak Flow --      Pain Score 09/16/21 1337 0     Pain Loc --      Pain Edu? --       Excl. in [place]? --    No data found.  Updated Vital Signs BP (!) 144/74 (BP Location: Right Arm)   Pulse (!) 59   Temp 98.1 F (36.7 C) (Oral)   Resp 18   SpO2 100%   Visual Acuity Right Eye Distance:   Left Eye Distance:   Bilateral Distance:    Right Eye Near:   Left Eye Near:    Bilateral Near:     Physical Exam Vitals and nursing note reviewed.  Constitutional:      General: She is awake. She is not in acute distress.    Appearance: She is well-developed and well-groomed. She is ill-appearing.     Comments: She is sitting in  the exam chair in no acute distress but appears tired and ill.   HENT:     Head: Normocephalic and atraumatic.     Right Ear: Hearing, tympanic membrane, ear canal and external ear normal.     Left Ear: Hearing, tympanic membrane, ear canal and external ear normal.     Nose: Nose normal. No congestion.     Right Sinus: No maxillary sinus tenderness or frontal sinus tenderness.     Left Sinus: No maxillary sinus tenderness or frontal sinus tenderness.     Mouth/Throat:     Lips: Pink.     Mouth: Mucous membranes are moist.     Pharynx: Uvula midline. Posterior oropharyngeal erythema present. No pharyngeal swelling, oropharyngeal exudate or uvula swelling.  Eyes:     Extraocular Movements: Extraocular movements intact.     Conjunctiva/sclera: Conjunctivae normal.  Cardiovascular:     Rate and Rhythm: Normal rate and regular rhythm.     Heart sounds: Normal heart sounds. No murmur heard. Pulmonary:     Effort: Pulmonary effort is normal. No respiratory distress.     Breath sounds: Normal air entry. No decreased air movement. Examination of the right-upper field reveals wheezing. Examination of the left-upper field reveals wheezing. Wheezing present. No decreased breath sounds, rhonchi or rales.  Musculoskeletal:     Cervical back: Normal range of motion and neck supple.  Lymphadenopathy:     Cervical: No cervical adenopathy.  Skin:    General:  Skin is warm and dry.     Capillary Refill: Capillary refill takes less than 2 seconds.     Findings: No rash.  Neurological:     General: No focal deficit present.     Mental Status: She is alert and oriented to person, place, and time.  Psychiatric:        Mood and Affect: Mood normal.        Behavior: Behavior normal. Behavior is cooperative.        Thought Content: Thought content normal.        Judgment: Judgment normal.     UC Treatments / Results  Labs (all labs ordered are listed, but only abnormal results are displayed) Labs Reviewed - No data to display  EKG   Radiology No results found.  Procedures Procedures (including critical care time)  Medications Ordered in UC Medications - No data to display  Initial Impression / Assessment and Plan / UC Course  I have reviewed the triage vital signs and the nursing notes.  Pertinent labs & imaging results that were available during my care of the patient were reviewed by me and considered in my medical decision making (see chart for details).     Reviewed with patient that she probably has a viral illness. Declines COVID 19 testing.  Discussed that she does not need antibiotics at this time. Symptoms/cough should slowly resolve over the next week or so. Recommend take Tessalon 100mg  1 every 8 hours as needed for cough. May use Albuterol inhaler 2 puffs every 6 hours as needed for cough or wheezing. Continue to push fluids to help loosen up mucus in chest. Follow-up in 4 to 5 days if not improving.    Final Clinical Impressions(s) / UC Diagnoses   Final diagnoses:  Acute cough  Wheezing  Generalized body aches     Discharge Instructions      Recommend use Tessalon cough pills 1 every 8 hours as needed. May use Albuterol inhaler 2 puffs every 6 hours  as needed for cough/wheezing. Continue to push fluids to help loosen up mucus in chest. Follow-up in 4 to 5 days if not improving.      ED Prescriptions      Medication Sig Dispense Auth. Provider   benzonatate (TESSALON PERLES) 100 MG capsule Take 1 capsule (100 mg total) by mouth every 8 (eight) hours as needed for cough. 21 capsule Katy Apo, NP   albuterol (VENTOLIN HFA) 108 (90 Base) MCG/ACT inhaler Inhale 2 puffs into the lungs every 6 (six) hours as needed for wheezing (or cough). 18 g Katy Apo, NP      PDMP not reviewed this encounter.   Katy Apo, NP 09/17/21 [place]

## 2022-01-16 ENCOUNTER — Ambulatory Visit: Payer: Medicare Other | Admitting: Anesthesiology

## 2022-01-16 ENCOUNTER — Other Ambulatory Visit: Payer: Self-pay

## 2022-01-16 ENCOUNTER — Encounter
Admission: RE | Disposition: A | Discharge status: Home / Self Care | Payer: Self-pay | Source: Ambulatory Visit | Attending: Gastroenterology

## 2022-01-16 ENCOUNTER — Ambulatory Visit
Admission: RE | Admit: 2022-01-16 | Discharge: 2022-01-16 | Disposition: A | Discharge status: Home / Self Care | Payer: Medicare Other | Source: Ambulatory Visit | Attending: Gastroenterology | Admitting: Gastroenterology

## 2022-01-16 ENCOUNTER — Encounter: Payer: Self-pay | Admitting: *Deleted

## 2022-01-16 DIAGNOSIS — K2970 Gastritis, unspecified, without bleeding: Secondary | ICD-10-CM | POA: Diagnosis not present

## 2022-01-16 DIAGNOSIS — M810 Age-related osteoporosis without current pathological fracture: Secondary | ICD-10-CM | POA: Insufficient documentation

## 2022-01-16 DIAGNOSIS — K219 Gastro-esophageal reflux disease without esophagitis: Secondary | ICD-10-CM | POA: Diagnosis not present

## 2022-01-16 DIAGNOSIS — K222 Esophageal obstruction: Secondary | ICD-10-CM | POA: Diagnosis not present

## 2022-01-16 DIAGNOSIS — Z8616 Personal history of COVID-19: Secondary | ICD-10-CM | POA: Diagnosis not present

## 2022-01-16 DIAGNOSIS — Z7982 Long term (current) use of aspirin: Secondary | ICD-10-CM | POA: Diagnosis not present

## 2022-01-16 DIAGNOSIS — G43909 Migraine, unspecified, not intractable, without status migrainosus: Secondary | ICD-10-CM | POA: Insufficient documentation

## 2022-01-16 DIAGNOSIS — R1310 Dysphagia, unspecified: Secondary | ICD-10-CM | POA: Insufficient documentation

## 2022-01-16 DIAGNOSIS — K449 Diaphragmatic hernia without obstruction or gangrene: Secondary | ICD-10-CM | POA: Diagnosis not present

## 2022-01-16 HISTORY — PX: ESOPHAGOGASTRODUODENOSCOPY (EGD) WITH PROPOFOL: SHX5813

## 2022-01-16 SURGERY — ESOPHAGOGASTRODUODENOSCOPY (EGD) WITH PROPOFOL
Anesthesia: General

## 2022-01-16 MED ORDER — PROPOFOL 10 MG/ML IV BOLUS
INTRAVENOUS | Status: DC | PRN
  Administered 2022-01-16: 100 mg via INTRAVENOUS
  Administered 2022-01-16 (×3): 20 mg via INTRAVENOUS

## 2022-01-16 MED ORDER — SODIUM CHLORIDE 0.9 % IV SOLN: INTRAVENOUS | Status: DC

## 2022-01-16 MED ORDER — LIDOCAINE HCL (CARDIAC) PF 100 MG/5ML IV SOSY
PREFILLED_SYRINGE | INTRAVENOUS | Status: DC | PRN
  Administered 2022-01-16: 100 mg via INTRAVENOUS

## 2022-01-16 NOTE — Op Note (Signed)
[facility] Patient Name: Brittney Campos Procedure Date: 01/16/2022 1:26 PM MRN: [id number] Account #: [id number] Date of Birth: 11/15/1951 Admit Type: Outpatient Age: 71 Room: [facility] ENDO ROOM 3 Gender: Female Note Status: Finalized Instrument Name: Upper Endoscope [id number] Procedure:             Upper GI endoscopy Indications:           Dysphagia Providers:             Andrey Farmer MD, MD Referring MD:          Precious Bard, MD (Referring MD) Medicines:             Monitored Anesthesia Care Complications:         No immediate complications. Estimated blood loss:                         Minimal. Procedure:             Pre-Anesthesia Assessment:                        - Prior to the procedure, a History and Physical was                         performed, and patient medications and allergies were                         reviewed. The patient is competent. The risks and                         benefits of the procedure and the sedation options and                         risks were discussed with the patient. All questions                         were answered and informed consent was obtained.                         Patient identification and proposed procedure were                         verified by the physician, the nurse, the                         anesthesiologist, the anesthetist and the technician                         in the endoscopy suite. Mental Status Examination:                         alert and oriented. Airway Examination: normal                         oropharyngeal airway and neck mobility. Respiratory                         Examination: clear to auscultation. CV Examination:  normal. Prophylactic Antibiotics: The patient does not                         require prophylactic antibiotics. Prior                         Anticoagulants: The patient has taken no previous                          anticoagulant or antiplatelet agents. ASA Grade                         Assessment: II - A patient with mild systemic disease.                         After reviewing the risks and benefits, the patient                         was deemed in satisfactory condition to undergo the                         procedure. The anesthesia plan was to use monitored                         anesthesia care (MAC). Immediately prior to                         administration of medications, the patient was                         re-assessed for adequacy to receive sedatives. The                         heart rate, respiratory rate, oxygen saturations,                         blood pressure, adequacy of pulmonary ventilation, and                         response to care were monitored throughout the                         procedure. The physical status of the patient was                         re-assessed after the procedure.                        After obtaining informed consent, the endoscope was                         passed under direct vision. Throughout the procedure,                         the patient's blood pressure, pulse, and oxygen                         saturations were monitored continuously. The Endoscope  was introduced through the mouth, and advanced to the                         second part of duodenum. The upper GI endoscopy was                         accomplished without difficulty. The patient tolerated                         the procedure well. Findings:      A 3 cm hiatal hernia was present.      Normal mucosa was found in the entire esophagus. Biopsies were obtained       from the proximal and distal esophagus with cold forceps for histology       of suspected eosinophilic esophagitis. Estimated blood loss was minimal.      Patchy mild inflammation characterized by erythema was found in the       gastric antrum. Biopsies were taken with a cold forceps  for Helicobacter       pylori testing. Estimated blood loss was minimal.      The examined duodenum was normal. Impression:            - 3 cm hiatal hernia.                        - Normal mucosa was found in the entire esophagus.                         Biopsied.                        - Gastritis. Biopsied.                        - Normal examined duodenum. Recommendation:        - Discharge patient to home.                        - Resume previous diet.                        - Continue present medications.                        - Await pathology results.                        - Return to referring physician as previously                         scheduled. Procedure Code(s):     --- Professional ---                        [phone], Esophagogastroduodenoscopy, flexible,                         transoral; with biopsy, single or multiple Diagnosis Code(s):     --- Professional ---                        K44.9, Diaphragmatic hernia without obstruction or  gangrene                        K29.70, Gastritis, unspecified, without bleeding                        R13.10, Dysphagia, unspecified CPT copyright 2019 American Medical Association. All rights reserved. The codes documented in this report are preliminary and upon coder review may  be revised to meet current compliance requirements. Andrey Farmer MD, MD 01/16/2022 1:44:10 PM Number of Addenda: 0 Note Initiated On: 01/16/2022 1:26 PM Estimated Blood Loss:  Estimated blood loss was minimal.      [facility]

## 2022-01-16 NOTE — Anesthesia Postprocedure Evaluation (Signed)
Anesthesia Post Note  Patient: Brittney Campos  Procedure(s) Performed: ESOPHAGOGASTRODUODENOSCOPY (EGD) WITH PROPOFOL  Patient location during evaluation: Endoscopy Anesthesia Type: General Level of consciousness: awake and alert Pain management: pain level controlled Vital Signs Assessment: post-procedure vital signs reviewed and stable Respiratory status: spontaneous breathing, nonlabored ventilation, respiratory function stable and patient connected to nasal cannula oxygen Cardiovascular status: blood pressure returned to baseline and stable Postop Assessment: no apparent nausea or vomiting Anesthetic complications: no   No notable events documented.   Last Vitals:  Vitals:   01/16/22 1344 01/16/22 1354  BP: 137/64 (!) 143/74  Pulse: 83 72  Resp: 16 12  Temp:  (!) 35.7 C  SpO2: 96% 99%    Last Pain:  Vitals:   01/16/22 1354  TempSrc: Temporal  PainSc: 0-No pain                 Arita Miss

## 2022-01-16 NOTE — Transfer of Care (Signed)
Immediate Anesthesia Transfer of Care Note  Patient: Brittney Campos  Procedure(s) Performed: ESOPHAGOGASTRODUODENOSCOPY (EGD) WITH PROPOFOL  Patient Location: PACU  Anesthesia Type:General  Level of Consciousness: awake  Airway & Oxygen Therapy: Patient Spontanous Breathing  Post-op Assessment: Report given to RN  Post vital signs: stable  Last Vitals:  Vitals Value Taken Time  BP    Temp    Pulse    Resp    SpO2      Last Pain:  Vitals:   01/16/22 1234  TempSrc: Temporal  PainSc: 4          Complications: No notable events documented.

## 2022-01-16 NOTE — Interval H&P Note (Signed)
History and Physical Interval Note:  01/16/2022 1:27 PM  Brittney Campos  has presented today for surgery, with the diagnosis of GERD K21.00.  The various methods of treatment have been discussed with the patient and family. After consideration of risks, benefits and other options for treatment, the patient has consented to  Procedure(s): ESOPHAGOGASTRODUODENOSCOPY (EGD) WITH PROPOFOL (N/A) as a surgical intervention.  The patient's history has been reviewed, patient examined, no change in status, stable for surgery.  I have reviewed the patient's chart and labs.  Questions were answered to the patient's satisfaction.     [name]  Ok to proceed with EGD

## 2022-01-16 NOTE — H&P (Signed)
Outpatient short stay form Pre-procedure 01/16/2022  [name], MD  Primary Physician: Marinda Elk, MD  Reason for visit:  Dysphagia  History of present illness:    71 y/o lady with history of GERD and history of esophageal strictures here for dysphagia which occurred after COVID infection in December. No blood thinners except aspirin. No family history of GI malignancies. No significant abdominal surgeries. Gets choked up on solids but not liquids.    Current Facility-Administered Medications:    0.9 %  sodium chloride infusion, , Intravenous, Continuous, Arelyn Gauer, Hilton Cork, MD, Last Rate: 20 mL/hr at 01/16/22 1254, New Bag at 01/16/22 1254  Medications Prior to Admission  Medication Sig Dispense Refill Last Dose   ALPRAZolam (XANAX) 0.25 MG tablet Take 0.25 mg by mouth 3 (three) times daily as needed for anxiety.   Past Week   aspirin EC 81 MG tablet Take 81 mg by mouth daily.   Past Week   calcium carbonate (OSCAL) 1500 (600 Ca) MG TABS tablet Take 600 mg of elemental calcium by mouth daily. Plus Vit D3   Past Week   cyclobenzaprine (FLEXERIL) 5 MG tablet Take 5 mg by mouth 2 (two) times daily as needed for muscle spasms.   01/15/2022   Galcanezumab-gnlm 120 MG/ML SOAJ Inject 120 mg into the skin every 30 (thirty) days.   Past Month   HYDROcodone-acetaminophen (NORCO/VICODIN) 5-325 MG tablet Take 1 tablet by mouth every 6 (six) hours as needed for moderate pain.   Past Week   metoprolol succinate (TOPROL-XL) 25 MG 24 hr tablet Take by mouth.   01/15/2022   Multiple Vitamin (MULTIVITAMIN) tablet Take 1 tablet by mouth daily.   Past Week   naproxen sodium (ALEVE) 220 MG tablet Take 220 mg by mouth 2 (two) times daily with a meal.   01/15/2022 at 2230   pantoprazole (PROTONIX) 40 MG tablet Take 40 mg by mouth daily.   Past Week   promethazine (PHENERGAN) 25 MG tablet Take 25 mg by mouth every 8 (eight) hours as needed for nausea or vomiting.    Past Month   SUMAtriptan  (IMITREX) 100 MG tablet Take 100 mg by mouth every 2 (two) hours as needed for migraine. May repeat in 2 hours if headache persists or recurs.   Past Month   topiramate (TOPAMAX) 50 MG tablet Take 50 mg by mouth daily.   Past Month   albuterol (VENTOLIN HFA) 108 (90 Base) MCG/ACT inhaler Inhale 2 puffs into the lungs every 6 (six) hours as needed for wheezing (or cough). (Patient not taking: Reported on 01/16/2022) 18 g 0 Completed Course   benzonatate (TESSALON PERLES) 100 MG capsule Take 1 capsule (100 mg total) by mouth every 8 (eight) hours as needed for cough. (Patient not taking: Reported on 01/16/2022) 21 capsule 0 Completed Course   denosumab (PROLIA) 60 MG/ML SOSY injection Inject into the skin.      linaclotide (LINZESS) 72 MCG capsule Take by mouth. (Patient not taking: Reported on 01/16/2022)   Completed Course     Allergies  Allergen Reactions   Biaxin [Clarithromycin] Nausea Only and Other (See Comments)    Stomach problems   Fosamax [Alendronate Sodium] Nausea Only and Other (See Comments)    Stomach problems   Macrobid [Nitrofurantoin Macrocrystal] Nausea Only and Other (See Comments)    Stomach problems   Prednisone Nausea Only    Makes pt nervous, stomach problems   Sulfamethoxazole-Trimethoprim Nausea Only and Other (See Comments)  Stomach problems   Venlafaxine Nausea Only and Other (See Comments)    Stomach problems     Past Medical History:  Diagnosis Date   Bladder infection, chronic    GERD (gastroesophageal reflux disease)    Headache    Migraine    Mitral valve disorder    Sciatica    Senile osteoporosis     Review of systems:  Otherwise negative.    Physical Exam  Gen: Alert, oriented. Appears stated age.  HEENT: PERRLA. Lungs: No respiratory distress CV: RRR Abd: soft, benign, no masses Ext: No edema    Planned procedures: Proceed with EGD. The patient understands the nature of the planned procedure, indications, risks, alternatives and  potential complications including but not limited to bleeding, infection, perforation, damage to internal organs and possible oversedation/side effects from anesthesia. The patient agrees and gives consent to proceed.  Please refer to procedure notes for findings, recommendations and patient disposition/instructions.     [name], MD [facility]

## 2022-01-16 NOTE — Anesthesia Preprocedure Evaluation (Signed)
Anesthesia Evaluation  Patient identified by MRN, date of birth, ID band Patient awake    Reviewed: Allergy & Precautions, NPO status , Patient's Chart, lab work & pertinent test results  History of Anesthesia Complications Negative for: history of anesthetic complications  Airway Mallampati: II  TM Distance: >3 FB Neck ROM: Full    Dental no notable dental hx. (+) Teeth Intact   Pulmonary neg pulmonary ROS, neg sleep apnea, neg COPD, Patient abstained from smoking.Not current smoker,    Pulmonary exam normal breath sounds clear to auscultation       Cardiovascular Exercise Tolerance: Good METS(-) hypertension(-) CAD and (-) Past MI (-) dysrhythmias  Rhythm:Regular Rate:Normal - Systolic murmurs    Neuro/Psych  Headaches,  Neuromuscular disease negative psych ROS   GI/Hepatic GERD  Medicated,(+)     (-) substance abuse  ,   Endo/Other  neg diabetes  Renal/GU negative Renal ROS     Musculoskeletal   Abdominal   Peds  Hematology   Anesthesia Other Findings Past Medical History: No date: Bladder infection, chronic No date: GERD (gastroesophageal reflux disease) No date: Headache No date: Migraine No date: Mitral valve disorder No date: Sciatica No date: Senile osteoporosis  Reproductive/Obstetrics                             Anesthesia Physical Anesthesia Plan  ASA: 2  Anesthesia Plan: General   Post-op Pain Management: Minimal or no pain anticipated   Induction: Intravenous  PONV Risk Score and Plan: 3 and Propofol infusion, TIVA and Ondansetron  Airway Management Planned: Nasal Cannula  Additional Equipment: None  Intra-op Plan:   Post-operative Plan:   Informed Consent: I have reviewed the patients History and Physical, chart, labs and discussed the procedure including the risks, benefits and alternatives for the proposed anesthesia with the patient or authorized  representative who has indicated his/her understanding and acceptance.     Dental advisory given  Plan Discussed with: CRNA and Surgeon  Anesthesia Plan Comments: (Discussed risks of anesthesia with patient, including possibility of difficulty with spontaneous ventilation under anesthesia necessitating airway intervention, PONV, and rare risks such as cardiac or respiratory or neurological events, and allergic reactions. Discussed the role of CRNA in patient's perioperative care. Patient understands.)        Anesthesia Quick Evaluation

## 2022-01-19 ENCOUNTER — Encounter: Payer: Self-pay | Admitting: Gastroenterology

## 2022-01-20 ENCOUNTER — Other Ambulatory Visit: Payer: Self-pay | Admitting: Pathology

## 2022-01-20 LAB — SURGICAL PATHOLOGY

## 2022-05-25 ENCOUNTER — Ambulatory Visit: Payer: Medicare Other

## 2022-06-10 ENCOUNTER — Ambulatory Visit
Admission: RE | Admit: 2022-06-10 | Discharge: 2022-06-10 | Disposition: A | Discharge status: Home / Self Care | Payer: Medicare Other | Source: Ambulatory Visit | Attending: Physician Assistant | Admitting: Physician Assistant

## 2022-06-10 DIAGNOSIS — Z1231 Encounter for screening mammogram for malignant neoplasm of breast: Secondary | ICD-10-CM | POA: Insufficient documentation

## 2022-10-06 ENCOUNTER — Emergency Department: Payer: Medicare Other

## 2022-10-06 ENCOUNTER — Other Ambulatory Visit: Payer: Self-pay

## 2022-10-06 ENCOUNTER — Emergency Department
Admission: EM | Admit: 2022-10-06 | Discharge: 2022-10-06 | Disposition: A | Discharge status: Home / Self Care | Payer: Medicare Other | Attending: Emergency Medicine | Admitting: Emergency Medicine

## 2022-10-06 DIAGNOSIS — S060X0A Concussion without loss of consciousness, initial encounter: Secondary | ICD-10-CM | POA: Diagnosis not present

## 2022-10-06 DIAGNOSIS — Y92410 Unspecified street and highway as the place of occurrence of the external cause: Secondary | ICD-10-CM | POA: Insufficient documentation

## 2022-10-06 DIAGNOSIS — S161XXA Strain of muscle, fascia and tendon at neck level, initial encounter: Secondary | ICD-10-CM | POA: Diagnosis not present

## 2022-10-06 DIAGNOSIS — R55 Syncope and collapse: Secondary | ICD-10-CM | POA: Diagnosis present

## 2022-10-06 DIAGNOSIS — S060XAA Concussion with loss of consciousness status unknown, initial encounter: Secondary | ICD-10-CM

## 2022-10-06 DIAGNOSIS — S0003XA Contusion of scalp, initial encounter: Secondary | ICD-10-CM | POA: Insufficient documentation

## 2022-10-06 LAB — COMPREHENSIVE METABOLIC PANEL
ALT: 16 U/L (ref 0–44)
AST: 21 U/L (ref 15–41)
Albumin: 3.6 g/dL (ref 3.5–5.0)
Alkaline Phosphatase: 60 U/L (ref 38–126)
Anion gap: 4 — ABNORMAL LOW (ref 5–15)
BUN: 15 mg/dL (ref 8–23)
CO2: 21 mmol/L — ABNORMAL LOW (ref 22–32)
Calcium: 9.8 mg/dL (ref 8.9–10.3)
Chloride: 115 mmol/L — ABNORMAL HIGH (ref 98–111)
Creatinine, Ser: 1.08 mg/dL — ABNORMAL HIGH (ref 0.44–1.00)
GFR, Estimated: 55 mL/min — ABNORMAL LOW (ref >60)
Glucose, Bld: 101 mg/dL — ABNORMAL HIGH (ref 70–99)
Potassium: 4.1 mmol/L (ref 3.5–5.1)
Sodium: 140 mmol/L (ref 135–145)
Total Bilirubin: 0.6 mg/dL (ref 0.3–1.2)
Total Protein: 6.2 g/dL — ABNORMAL LOW (ref 6.5–8.1)

## 2022-10-06 LAB — CBC WITH DIFFERENTIAL/PLATELET
Abs Immature Granulocytes: 0.03 10*3/uL (ref 0.00–0.07)
Basophils Absolute: 0.1 10*3/uL (ref 0.0–0.1)
Basophils Relative: 1 %
Eosinophils Absolute: 0.1 10*3/uL (ref 0.0–0.5)
Eosinophils Relative: 2 %
HCT: 42.8 % (ref 36.0–46.0)
Hemoglobin: 13.8 g/dL (ref 12.0–15.0)
Immature Granulocytes: 0 %
Lymphocytes Relative: 23 %
Lymphs Abs: 2 10*3/uL (ref 0.7–4.0)
MCH: 29.4 pg (ref 26.0–34.0)
MCHC: 32.2 g/dL (ref 30.0–36.0)
MCV: 91.3 fL (ref 80.0–100.0)
Monocytes Absolute: 0.7 10*3/uL (ref 0.1–1.0)
Monocytes Relative: 8 %
Neutro Abs: 5.5 10*3/uL (ref 1.7–7.7)
Neutrophils Relative %: 66 %
Platelets: 218 10*3/uL (ref 150–400)
RBC: 4.69 MIL/uL (ref 3.87–5.11)
RDW: 13.9 % (ref 11.5–15.5)
WBC: 8.4 10*3/uL (ref 4.0–10.5)
nRBC: 0 % (ref 0.0–0.2)

## 2022-10-06 LAB — URINALYSIS, ROUTINE W REFLEX MICROSCOPIC
Bilirubin Urine: NEGATIVE
Glucose, UA: NEGATIVE mg/dL
Hgb urine dipstick: NEGATIVE
Ketones, ur: NEGATIVE mg/dL
Nitrite: NEGATIVE
Protein, ur: NEGATIVE mg/dL
Specific Gravity, Urine: 1.004 — ABNORMAL LOW (ref 1.005–1.030)
pH: 7 (ref 5.0–8.0)

## 2022-10-06 LAB — TROPONIN I (HIGH SENSITIVITY): Troponin I (High Sensitivity): 5 ng/L (ref <18)

## 2022-10-06 MED ORDER — SODIUM CHLORIDE 0.9 % IV BOLUS
1000.0000 mL | Freq: Once | INTRAVENOUS | Status: AC
  Administered 2022-10-06: 1000 mL via INTRAVENOUS

## 2022-10-06 MED ORDER — ONDANSETRON 4 MG PO TBDP: 4.0000 mg | ORAL_TABLET | Freq: Three times a day (TID) | ORAL | 0 refills | Status: AC | PRN

## 2022-10-06 MED ORDER — ONDANSETRON HCL 4 MG/2ML IJ SOLN
4.0000 mg | Freq: Once | INTRAMUSCULAR | Status: AC
  Administered 2022-10-06: 4 mg via INTRAVENOUS
  Filled 2022-10-06: qty 2
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
## 2022-10-06 NOTE — ED Notes (Addendum)
Pt states her nausea has decreased. Pt stood and walked to bathroom with RN assistance. Pt states dizziness has decreased upon first standing. Walking back to the bed, pt stated she felt "foggy" in her head and that the nausea increased with movement.
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
## 2022-10-06 NOTE — ED Notes (Signed)
   10/06/22 1147  Orthostatic Lying   BP- Lying 140/58  Pulse- Lying 65  Orthostatic Sitting  BP- Sitting 153/79  Pulse- Sitting 61  Orthostatic Standing at 0 minutes  BP- Standing at 0 minutes 163/78  Pulse- Standing at 0 minutes 73

## 2022-10-07 ENCOUNTER — Ambulatory Visit
Admission: RE | Admit: 2022-10-07 | Discharge: 2022-10-07 | Disposition: A | Discharge status: Home / Self Care | Payer: Medicare Other | Source: Ambulatory Visit | Attending: Physician Assistant | Admitting: Physician Assistant

## 2022-10-07 ENCOUNTER — Other Ambulatory Visit: Payer: Self-pay | Admitting: Physician Assistant

## 2022-10-07 DIAGNOSIS — R42 Dizziness and giddiness: Secondary | ICD-10-CM | POA: Diagnosis present

## 2022-10-07 DIAGNOSIS — S0990XD Unspecified injury of head, subsequent encounter: Secondary | ICD-10-CM | POA: Diagnosis present

## 2022-10-07 DIAGNOSIS — G44311 Acute post-traumatic headache, intractable: Secondary | ICD-10-CM | POA: Diagnosis present

## 2022-12-29 ENCOUNTER — Ambulatory Visit
Admission: EM | Admit: 2022-12-29 | Discharge: 2022-12-29 | Disposition: A | Discharge status: Home / Self Care | Payer: Medicare Other | Attending: Internal Medicine | Admitting: Internal Medicine

## 2022-12-29 DIAGNOSIS — R062 Wheezing: Secondary | ICD-10-CM | POA: Insufficient documentation

## 2022-12-29 DIAGNOSIS — Z7951 Long term (current) use of inhaled steroids: Secondary | ICD-10-CM | POA: Diagnosis not present

## 2022-12-29 DIAGNOSIS — R110 Nausea: Secondary | ICD-10-CM | POA: Diagnosis not present

## 2022-12-29 DIAGNOSIS — R058 Other specified cough: Secondary | ICD-10-CM | POA: Diagnosis not present

## 2022-12-29 DIAGNOSIS — K219 Gastro-esophageal reflux disease without esophagitis: Secondary | ICD-10-CM | POA: Diagnosis not present

## 2022-12-29 DIAGNOSIS — R519 Headache, unspecified: Secondary | ICD-10-CM | POA: Diagnosis not present

## 2022-12-29 DIAGNOSIS — J069 Acute upper respiratory infection, unspecified: Secondary | ICD-10-CM | POA: Insufficient documentation

## 2022-12-29 DIAGNOSIS — Z1152 Encounter for screening for COVID-19: Secondary | ICD-10-CM | POA: Insufficient documentation

## 2022-12-29 LAB — RESP PANEL BY RT-PCR (RSV, FLU A&B, COVID)  RVPGX2
Influenza A by PCR: NEGATIVE
Influenza B by PCR: NEGATIVE
Resp Syncytial Virus by PCR: NEGATIVE
SARS Coronavirus 2 by RT PCR: NEGATIVE

## 2022-12-29 MED ORDER — GUAIFENESIN-CODEINE 100-10 MG/5ML PO SOLN: 5.0000 mL | Freq: Four times a day (QID) | ORAL | 0 refills | Status: AC | PRN

## 2022-12-29 NOTE — Discharge Instructions (Addendum)
Your symptoms today are most likely being caused by a virus and should steadily improve in time it can take up to 7 to 10 days before you truly start to see a turnaround however things will get better  COVID, flu and RSV testing negative, if having concerns about being tested too early you may retest with home test in 2 days, if positive you may return to receive antiviral  You may use cough syrup every 6 hours as needed, be mindful this medication may make you feel drowsy      You can take Tylenol and/or Ibuprofen as needed for fever reduction and pain relief.   For cough: honey 1/2 to 1 teaspoon (you can dilute the honey in water or another fluid).  You can also use guaifenesin and dextromethorphan for cough. You can use a humidifier for chest congestion and cough.  If you don't have a humidifier, you can sit in the bathroom with the hot shower running.      For sore throat: try warm salt water gargles, cepacol lozenges, throat spray, warm tea or water with lemon/honey, popsicles or ice, or OTC cold relief medicine for throat discomfort.   For congestion: take a daily anti-histamine like Zyrtec, Claritin, and a oral decongestant, such as pseudoephedrine.  You can also use Flonase 1-2 sprays in each nostril daily.   It is important to stay hydrated: drink plenty of fluids (water, gatorade/powerade/pedialyte, juices, or teas) to keep your throat moisturized and help further relieve irritation/discomfort.

## 2022-12-29 NOTE — ED Triage Notes (Signed)
Pt reports coughing, headache, body aches, chills, mucus, and runny nose x 3 days.  Exposed to covid and husband was sick but no test

## 2022-12-29 NOTE — ED Provider Notes (Signed)
[facility]    CSN: [id number] Arrival date & time: 12/29/22  1019      History   Chief Complaint No chief complaint on file.   HPI Brittney Campos is a 72 y.o. female.   Patient presents for evaluation of chills, nasal congestion, rhinorrhea, sore throat, cough and headaches beginning 3 days ago.  Associated nausea without vomiting but has been able to tolerate liquids, decreased.  Endorses experiencing mild wheezing.  Symptoms worsening 1 day ago she was having persistent chills.  Known sick contact with exposure to [place], husband has similar symptoms.  Has attempted use of Mucinex day and night.  History of migraines headaches has Imitrex and antiemetics available but has not used.  Denies shortness of breath.  Past Medical History:  Diagnosis Date   Bladder infection, chronic    GERD (gastroesophageal reflux disease)    Headache    Migraine    Mitral valve disorder    Sciatica    Senile osteoporosis     There are no problems to display for this patient.   Past Surgical History:  Procedure Laterality Date   ABDOMINAL HYSTERECTOMY     BREAST EXCISIONAL BIOPSY Left 08/21/04   neg   COLONOSCOPY W/ POLYPECTOMY     COLONOSCOPY WITH PROPOFOL N/A 09/29/2018   Procedure: COLONOSCOPY WITH PROPOFOL;  Surgeon: Lollie Sails, MD;  Location: [facility] ENDOSCOPY;  Service: Endoscopy;  Laterality: N/A;   ESOPHAGOGASTRODUODENOSCOPY (EGD) WITH PROPOFOL N/A 01/26/2017   Procedure: ESOPHAGOGASTRODUODENOSCOPY (EGD) WITH PROPOFOL;  Surgeon: Lollie Sails, MD;  Location: [facility] ENDOSCOPY;  Service: Endoscopy;  Laterality: N/A;   ESOPHAGOGASTRODUODENOSCOPY (EGD) WITH PROPOFOL N/A 01/16/2022   Procedure: ESOPHAGOGASTRODUODENOSCOPY (EGD) WITH PROPOFOL;  Surgeon: [name], MD;  Location: [facility] ENDOSCOPY;  Service: Endoscopy;  Laterality: N/A;   FL INJ RT SHOULDER CT ARTHROGRAM ([facility] HX)     INCONTINENCE SURGERY     LEFT HEART CATH AND CORONARY ANGIOGRAPHY Left 04/05/2018    Procedure: LEFT HEART CATH AND CORONARY ANGIOGRAPHY;  Surgeon: Teodoro Spray, MD;  Location: [facility] LAB;  Service: Cardiovascular;  Laterality: Left;    OB History   No obstetric history on file.      Home Medications    Prior to Admission medications   Medication Sig Start Date End Date Taking? Authorizing Provider  albuterol (VENTOLIN HFA) 108 (90 Base) MCG/ACT inhaler Inhale 2 puffs into the lungs every 6 (six) hours as needed for wheezing (or cough). Patient not taking: Reported on 01/16/2022 09/16/21   Katy Apo, NP  ALPRAZolam Duanne Moron) 0.25 MG tablet Take 0.25 mg by mouth 3 (three) times daily as needed for anxiety.    [provider]  aspirin EC 81 MG tablet Take 81 mg by mouth daily.    [provider]  benzonatate (TESSALON PERLES) 100 MG capsule Take 1 capsule (100 mg total) by mouth every 8 (eight) hours as needed for cough. Patient not taking: Reported on 01/16/2022 09/16/21   Katy Apo, NP  calcium carbonate (OSCAL) 1500 (600 Ca) MG TABS tablet Take 600 mg of elemental calcium by mouth daily. Plus Vit D3    [provider]  cyclobenzaprine (FLEXERIL) 5 MG tablet Take 5 mg by mouth 2 (two) times daily as needed for muscle spasms.    [provider]  denosumab (PROLIA) 60 MG/ML SOSY injection Inject into the skin.    [provider]  Galcanezumab-gnlm 120 MG/ML SOAJ Inject 120 mg into the skin  every 30 (thirty) days.    [provider]  HYDROcodone-acetaminophen (NORCO/VICODIN) 5-325 MG tablet Take 1 tablet by mouth every 6 (six) hours as needed for moderate pain.    [provider]  linaclotide Rolan Lipa) 72 MCG capsule Take by mouth. Patient not taking: Reported on 01/16/2022 03/19/20   [provider]  metoprolol succinate (TOPROL-XL) 25 MG 24 hr tablet Take by mouth. 04/29/18 01/16/22  [provider]  Multiple Vitamin (MULTIVITAMIN) tablet Take 1 tablet by mouth daily.     [provider]  naproxen sodium (ALEVE) 220 MG tablet Take 220 mg by mouth 2 (two) times daily with a meal.    [provider]  ondansetron (ZOFRAN-ODT) 4 MG disintegrating tablet Take 1 tablet (4 mg total) by mouth every 8 (eight) hours as needed for nausea or vomiting. 10/06/22   Nathaniel Man, MD  pantoprazole (PROTONIX) 40 MG tablet Take 40 mg by mouth daily.    [provider]  promethazine (PHENERGAN) 25 MG tablet Take 25 mg by mouth every 8 (eight) hours as needed for nausea or vomiting.     [provider]  SUMAtriptan (IMITREX) 100 MG tablet Take 100 mg by mouth every 2 (two) hours as needed for migraine. May repeat in 2 hours if headache persists or recurs.    [provider]  topiramate (TOPAMAX) 50 MG tablet Take 50 mg by mouth daily.    [provider]    Family History Family History  Problem Relation Age of Onset   Breast cancer Neg Hx     Social History Social History   Tobacco Use   Smoking status: Never   Smokeless tobacco: Never  Vaping Use   Vaping Use: Never used  Substance Use Topics   Alcohol use: No   Drug use: No     Allergies   Biaxin [clarithromycin], Fosamax [alendronate sodium], Macrobid [nitrofurantoin macrocrystal], Prednisone, Sulfamethoxazole-trimethoprim, and Venlafaxine   Review of Systems Review of Systems  Constitutional:  Positive for chills. Negative for activity change, appetite change, diaphoresis, fatigue, fever and unexpected weight change.  HENT:  Positive for congestion, rhinorrhea and sore throat. Negative for dental problem, drooling, ear discharge, ear pain, facial swelling, hearing loss, mouth sores, nosebleeds, postnasal drip, sinus pressure, sinus pain, sneezing, tinnitus, trouble swallowing and voice change.   Respiratory:  Positive for cough. Negative for apnea, choking, chest tightness, shortness of breath, wheezing and stridor.   Cardiovascular: Negative.    Gastrointestinal:  Positive for nausea. Negative for abdominal distention, abdominal pain, anal bleeding, blood in stool, constipation, diarrhea, rectal pain and vomiting.  Neurological:  Positive for headaches. Negative for dizziness, tremors, seizures, syncope, facial asymmetry, speech difficulty, weakness, light-headedness and numbness.     Physical Exam Triage Vital Signs ED Triage Vitals  Enc Vitals Group     BP 12/29/22 1029 128/66     Pulse Rate 12/29/22 1029 86     Resp 12/29/22 1029 18     Temp 12/29/22 1029 98.5 F (36.9 C)     Temp Source 12/29/22 1029 Oral     SpO2 12/29/22 1029 96 %     Weight --      Height --      Head Circumference --      Peak Flow --      Pain Score 12/29/22 1032 0     Pain Loc --      Pain Edu? --      Excl. in [place]? --  No data found.  Updated Vital Signs BP 128/66 (BP Location: Left Arm)   Pulse 86   Temp 98.5 F (36.9 C) (Oral)   Resp 18   SpO2 96%   Visual Acuity Right Eye Distance:   Left Eye Distance:   Bilateral Distance:    Right Eye Near:   Left Eye Near:    Bilateral Near:     Physical Exam Constitutional:      Appearance: Normal appearance.  HENT:     Head: Normocephalic.     Right Ear: Tympanic membrane, ear canal and external ear normal.     Left Ear: Tympanic membrane, ear canal and external ear normal.     Nose: Congestion and rhinorrhea present.     Mouth/Throat:     Mouth: Mucous membranes are moist.     Pharynx: No posterior oropharyngeal erythema.  Cardiovascular:     Rate and Rhythm: Normal rate and regular rhythm.     Pulses: Normal pulses.     Heart sounds: Normal heart sounds.  Pulmonary:     Effort: Pulmonary effort is normal.     Breath sounds: Normal breath sounds.  Skin:    General: Skin is warm and dry.  Neurological:     Mental Status: She is alert and oriented to person, place, and time. Mental status is at baseline.      UC Treatments / Results  Labs (all labs ordered are  listed, but only abnormal results are displayed) Labs Reviewed  RESP PANEL BY RT-PCR (RSV, FLU A&B, COVID)  [id number]    EKG   Radiology No results found.  Procedures Procedures (including critical care time)  Medications Ordered in UC Medications - No data to display  Initial Impression / Assessment and Plan / UC Course  I have reviewed the triage vital signs and the nursing notes.  Pertinent labs & imaging results that were available during my care of the patient were reviewed by me and considered in my medical decision making (see chart for details).  Viral URI with cough  Patient is in no signs of distress nor toxic appearing.  Vital signs are stable.  Low suspicion for pneumonia, pneumothorax or bronchitis and therefore will defer imaging. COVID, flu and RSV testing negative, discussed findings, patient concerned with being tested too early, recommended home test completed in 2 days and may return if positive for antivirals, prescribed guaifenesin codeine for management of cough, endorses Tessalon has been ineffective in the past.May use additional over-the-counter medications as needed for supportive care.  May follow-up with urgent care as needed if symptoms persist or worsen.   Final Clinical Impressions(s) / UC Diagnoses   Final diagnoses:  None   Discharge Instructions   None    ED Prescriptions   None    PDMP not reviewed this encounter.   Hans Eden, NP 12/29/22 [phone]

## 2023-05-05 ENCOUNTER — Other Ambulatory Visit: Payer: Self-pay | Admitting: Student

## 2023-05-05 DIAGNOSIS — G501 Atypical facial pain: Secondary | ICD-10-CM

## 2023-05-07 ENCOUNTER — Encounter: Payer: Self-pay | Admitting: Neurology

## 2023-05-10 ENCOUNTER — Ambulatory Visit
Admission: RE | Admit: 2023-05-10 | Discharge: 2023-05-10 | Disposition: A | Discharge status: Home / Self Care | Payer: Medicare Other | Source: Ambulatory Visit | Attending: Student | Admitting: Student

## 2023-05-10 DIAGNOSIS — G501 Atypical facial pain: Secondary | ICD-10-CM

## 2023-05-19 ENCOUNTER — Other Ambulatory Visit: Payer: Medicare Other

## 2023-09-15 ENCOUNTER — Other Ambulatory Visit: Payer: Self-pay | Admitting: Physician Assistant

## 2023-09-15 DIAGNOSIS — Z1231 Encounter for screening mammogram for malignant neoplasm of breast: Secondary | ICD-10-CM

## 2023-09-30 ENCOUNTER — Ambulatory Visit
Admission: RE | Admit: 2023-09-30 | Discharge: 2023-09-30 | Disposition: A | Discharge status: Home / Self Care | Payer: Medicare Other | Source: Ambulatory Visit | Attending: Physician Assistant | Admitting: Physician Assistant

## 2023-09-30 DIAGNOSIS — Z1231 Encounter for screening mammogram for malignant neoplasm of breast: Secondary | ICD-10-CM | POA: Diagnosis present

## 2023-10-06 ENCOUNTER — Ambulatory Visit
Admission: EM | Admit: 2023-10-06 | Discharge: 2023-10-06 | Disposition: A | Discharge status: Home / Self Care | Payer: Medicare Other | Attending: Emergency Medicine | Admitting: Emergency Medicine

## 2023-10-06 DIAGNOSIS — J0100 Acute maxillary sinusitis, unspecified: Secondary | ICD-10-CM

## 2023-10-06 MED ORDER — IPRATROPIUM BROMIDE 0.06 % NA SOLN: 2.0000 | Freq: Four times a day (QID) | NASAL | 12 refills | Status: AC

## 2023-10-06 NOTE — ED Provider Notes (Signed)
[facility]    CSN: [id number] Arrival date & time: 10/06/23  1417      History   Chief Complaint Chief Complaint  Patient presents with   Nasal Congestion    HPI VICIE CECH is a 72 y.o. female.   HPI  72 year old female with a past medical history significant for sciatica, mitral valve disorder, migraine headaches, and GERD presents for evaluation of nasal congestion with pain and swelling to the right side of her nose.  She reports that the external nasolabial fold was tender to palpation earlier today.  She has been using Mucinex but she has been unable to break up the congestion and has not been experiencing any nasal discharge.  She denies fever but reports she has had some chills.  No sore throat, ear pain, or cough.  Past Medical History:  Diagnosis Date   Bladder infection, chronic    GERD (gastroesophageal reflux disease)    Headache    Migraine    Mitral valve disorder    Sciatica    Senile osteoporosis     There are no problems to display for this patient.   Past Surgical History:  Procedure Laterality Date   ABDOMINAL HYSTERECTOMY     BREAST EXCISIONAL BIOPSY Left 08/21/04   neg   COLONOSCOPY W/ POLYPECTOMY     COLONOSCOPY WITH PROPOFOL N/A 09/29/2018   Procedure: COLONOSCOPY WITH PROPOFOL;  Surgeon: Christena Deem, MD;  Location: [facility] ENDOSCOPY;  Service: Endoscopy;  Laterality: N/A;   ESOPHAGOGASTRODUODENOSCOPY (EGD) WITH PROPOFOL N/A 01/26/2017   Procedure: ESOPHAGOGASTRODUODENOSCOPY (EGD) WITH PROPOFOL;  Surgeon: Christena Deem, MD;  Location: [facility] ENDOSCOPY;  Service: Endoscopy;  Laterality: N/A;   ESOPHAGOGASTRODUODENOSCOPY (EGD) WITH PROPOFOL N/A 01/16/2022   Procedure: ESOPHAGOGASTRODUODENOSCOPY (EGD) WITH PROPOFOL;  Surgeon: Regis Bill, MD;  Location: [facility] ENDOSCOPY;  Service: Endoscopy;  Laterality: N/A;   FL INJ RT SHOULDER CT ARTHROGRAM ([facility] HX)     INCONTINENCE SURGERY     LEFT HEART CATH AND CORONARY  ANGIOGRAPHY Left 04/05/2018   Procedure: LEFT HEART CATH AND CORONARY ANGIOGRAPHY;  Surgeon: Dalia Heading, MD;  Location: [facility] INVASIVE CV LAB;  Service: Cardiovascular;  Laterality: Left;    OB History   No obstetric history on file.      Home Medications    Prior to Admission medications   Medication Sig Start Date End Date Taking? Authorizing Provider  aspirin EC 81 MG tablet Take 81 mg by mouth daily.   Yes [provider]  calcium carbonate (OSCAL) 1500 (600 Ca) MG TABS tablet Take 600 mg of elemental calcium by mouth daily. Plus Vit D3   Yes [provider]  ipratropium (ATROVENT) 0.06 % nasal spray Place 2 sprays into both nostrils 4 (four) times daily. 10/06/23  Yes Becky Augusta, NP  Multiple Vitamin (MULTIVITAMIN) tablet Take 1 tablet by mouth daily.   Yes [provider]  pantoprazole (PROTONIX) 40 MG tablet Take 40 mg by mouth daily.   Yes [provider]  SUMAtriptan (IMITREX) 100 MG tablet Take 100 mg by mouth every 2 (two) hours as needed for migraine. May repeat in 2 hours if headache persists or recurs.   Yes [provider]  albuterol (VENTOLIN HFA) 108 (90 Base) MCG/ACT inhaler Inhale 2 puffs into the lungs every 6 (six) hours as needed for wheezing (or cough). Patient not taking: Reported on 01/16/2022 09/16/21   Sudie Grumbling, NP  ALPRAZolam Prudy Feeler) 0.25 MG tablet Take 0.25 mg by  mouth 3 (three) times daily as needed for anxiety.    [provider]  benzonatate (TESSALON PERLES) 100 MG capsule Take 1 capsule (100 mg total) by mouth every 8 (eight) hours as needed for cough. Patient not taking: Reported on 01/16/2022 09/16/21   Sudie Grumbling, NP  cyclobenzaprine (FLEXERIL) 5 MG tablet Take 5 mg by mouth 2 (two) times daily as needed for muscle spasms.    [provider]  denosumab (PROLIA) 60 MG/ML SOSY injection Inject into the skin.    [provider]  Galcanezumab-gnlm 120 MG/ML SOAJ Inject  120 mg into the skin every 30 (thirty) days.    [provider]  guaiFENesin-codeine 100-10 MG/5ML syrup Take 5 mLs by mouth every 6 (six) hours as needed for cough. 12/29/22   White, Elita Boone, NP  HYDROcodone-acetaminophen (NORCO/VICODIN) 5-325 MG tablet Take 1 tablet by mouth every 6 (six) hours as needed for moderate pain.    [provider]  linaclotide Karlene Einstein) 72 MCG capsule Take by mouth. Patient not taking: Reported on 01/16/2022 03/19/20   [provider]  metoprolol succinate (TOPROL-XL) 25 MG 24 hr tablet Take by mouth. 04/29/18 01/16/22  [provider]  naproxen sodium (ALEVE) 220 MG tablet Take 220 mg by mouth 2 (two) times daily with a meal.    [provider]  ondansetron (ZOFRAN-ODT) 4 MG disintegrating tablet Take 1 tablet (4 mg total) by mouth every 8 (eight) hours as needed for nausea or vomiting. 10/06/22   Corena Herter, MD  promethazine (PHENERGAN) 25 MG tablet Take 25 mg by mouth every 8 (eight) hours as needed for nausea or vomiting.     [provider]  topiramate (TOPAMAX) 50 MG tablet Take 50 mg by mouth daily.    [provider]    Family History Family History  Problem Relation Age of Onset   Breast cancer Neg Hx     Social History Social History   Tobacco Use   Smoking status: Never   Smokeless tobacco: Never  Vaping Use   Vaping status: Never Used  Substance Use Topics   Alcohol use: No   Drug use: No     Allergies   Biaxin [clarithromycin], Fosamax [alendronate sodium], Macrobid [nitrofurantoin macrocrystal], Prednisone, Sulfamethoxazole-trimethoprim, and Venlafaxine   Review of Systems Review of Systems  Constitutional:  Positive for chills. Negative for fever.  HENT:  Positive for sinus pressure and sinus pain. Negative for rhinorrhea.      Physical Exam Triage Vital Signs ED Triage Vitals [10/06/23 1434]  Encounter Vitals Group     BP      Systolic BP Percentile       Diastolic BP Percentile      Pulse      Resp      Temp      Temp src      SpO2      Weight      Height      Head Circumference      Peak Flow      Pain Score 6     Pain Loc      Pain Education      Exclude from Growth Chart    No data found.  Updated Vital Signs BP (!) 143/84 (BP Location: Right Arm)   Pulse 71   Temp 98 F (36.7 C) (Oral)   Resp 18   SpO2 98%   Visual Acuity Right Eye Distance:   Left Eye Distance:  Bilateral Distance:    Right Eye Near:   Left Eye Near:    Bilateral Near:     Physical Exam Vitals and nursing note reviewed.  Constitutional:      Appearance: Normal appearance. She is not ill-appearing.  HENT:     Head: Normocephalic and atraumatic.     Right Ear: Tympanic membrane, ear canal and external ear normal. There is no impacted cerumen.     Left Ear: Tympanic membrane, ear canal and external ear normal. There is no impacted cerumen.     Nose: Congestion present. No rhinorrhea.     Comments: Nasal mucosa is erythematous with mild edema but no appreciable discharge.  Right maxillary sinus appears swollen to visual inspection and is tender to palpation. Neurological:     Mental Status: She is alert.      UC Treatments / Results  Labs (all labs ordered are listed, but only abnormal results are displayed) Labs Reviewed - No data to display  EKG   Radiology No results found.  Procedures Procedures (including critical care time)  Medications Ordered in UC Medications - No data to display  Initial Impression / Assessment and Plan / UC Course  I have reviewed the triage vital signs and the nursing notes.  Pertinent labs & imaging results that were available during my care of the patient were reviewed by me and considered in my medical decision making (see chart for details).   Patient is a pleasant, nontoxic-appearing 72 year old female presenting for evaluation of right maxillary sinus tenderness with pain in her upper teeth.   She states that this started 3 days ago.  She denies any fever but she has had chills.  She is unable to get any discharge from her nasal passages.  She denies any ear pain, sore throat, or cough.  On exam nasal mucosa is erythematous with mild edema but no appreciable discharge.  The right maxillary sinus is tender to palpation and this tenderness extends medially.  I suspect that patient's right maxillary sinus is occluded that is causing the patient's symptoms.  Given that she is only had symptoms for 3 days I do not feel an antibiotic is warranted at this time.  I will discharge her home with Atrovent nasal spray to help with the congestion and I have encouraged her to perform sinus irrigation several times for the day to see if she can alleviate her mucus burden.  If her symptoms last longer than 7 to 10 days, she starts producing purulent mucus, or she starts running fevers we can reevaluate the possibility of needing antibiotics at that time.   Final Clinical Impressions(s) / UC Diagnoses   Final diagnoses:  Acute non-recurrent maxillary sinusitis     Discharge Instructions      As we discussed, I do believe that your right maxillary sinus is occluded but I do not feel you need an antibiotic at this time.  Use the Atrovent nasal spray, 2 squirts up each nostril every 6 hours as needed for nasal congestion.  Perform sinus irrigation 2-3 times a day with a [employer] sinus rinse kit and distilled water.  Do not use tap water.  You can use plain over-the-counter Mucinex every 6 hours to break up the stickiness of the mucus so your body can clear it.  Increase your oral fluid intake to thin out your mucus so that is also able for your body to clear more easily.  Take an over-the-counter probiotic, such as Culturelle-align-activia, 1 hour  after each dose of antibiotic to prevent diarrhea.  If you develop any new or worsening symptoms return for reevaluation or see your primary care provider.       ED Prescriptions     Medication Sig Dispense Auth. Provider   ipratropium (ATROVENT) 0.06 % nasal spray Place 2 sprays into both nostrils 4 (four) times daily. 15 mL Becky Augusta, NP      PDMP not reviewed this encounter.   Becky Augusta, NP 10/06/23 1501

## 2023-10-06 NOTE — Discharge Instructions (Addendum)
As we discussed, I do believe that your right maxillary sinus is occluded but I do not feel you need an antibiotic at this time.  Use the Atrovent nasal spray, 2 squirts up each nostril every 6 hours as needed for nasal congestion.  Perform sinus irrigation 2-3 times a day with a NeilMed sinus rinse kit and distilled water.  Do not use tap water.  You can use plain over-the-counter Mucinex every 6 hours to break up the stickiness of the mucus so your body can clear it.  Increase your oral fluid intake to thin out your mucus so that is also able for your body to clear more easily.  Take an over-the-counter probiotic, such as Culturelle-align-activia, 1 hour after each dose of antibiotic to prevent diarrhea.  If you develop any new or worsening symptoms return for reevaluation or see your primary care provider.

## 2023-10-06 NOTE — ED Triage Notes (Addendum)
Sx started Sunday night  Patient states that she is congested-nasal congestion that's green. No fever or coughing. Patient has been taking mucinex dm. Patient states that the right side of her nose on the inside is painful to the touch. Feels bruised. Right side of nose and eye was swollen when she woke up Monday.

## 2023-11-05 ENCOUNTER — Encounter: Payer: Self-pay | Admitting: *Deleted

## 2023-12-02 ENCOUNTER — Encounter: Payer: Self-pay | Admitting: *Deleted

## 2023-12-07 ENCOUNTER — Ambulatory Visit: Admission: RE | Admit: 2023-12-07 | Payer: Medicare Other | Source: Home / Self Care

## 2023-12-07 SURGERY — COLONOSCOPY WITH PROPOFOL
Anesthesia: General

## 2024-03-02 ENCOUNTER — Encounter: Payer: Self-pay | Admitting: *Deleted

## 2024-03-14 ENCOUNTER — Encounter
Admission: RE | Disposition: A | Discharge status: Home / Self Care | Payer: Self-pay | Source: Home / Self Care | Attending: Gastroenterology

## 2024-03-14 ENCOUNTER — Encounter: Payer: Self-pay | Admitting: *Deleted

## 2024-03-14 ENCOUNTER — Other Ambulatory Visit: Payer: Self-pay

## 2024-03-14 ENCOUNTER — Ambulatory Visit
Admission: RE | Admit: 2024-03-14 | Discharge: 2024-03-14 | Disposition: A | Discharge status: Home / Self Care | Payer: Medicare Other | Attending: Gastroenterology | Admitting: Gastroenterology

## 2024-03-14 ENCOUNTER — Ambulatory Visit: Admitting: Anesthesiology

## 2024-03-14 DIAGNOSIS — D123 Benign neoplasm of transverse colon: Secondary | ICD-10-CM | POA: Insufficient documentation

## 2024-03-14 DIAGNOSIS — K640 First degree hemorrhoids: Secondary | ICD-10-CM | POA: Insufficient documentation

## 2024-03-14 DIAGNOSIS — R7303 Prediabetes: Secondary | ICD-10-CM | POA: Diagnosis not present

## 2024-03-14 DIAGNOSIS — Z800 Family history of malignant neoplasm of digestive organs: Secondary | ICD-10-CM | POA: Diagnosis present

## 2024-03-14 DIAGNOSIS — Z1211 Encounter for screening for malignant neoplasm of colon: Secondary | ICD-10-CM | POA: Diagnosis not present

## 2024-03-14 DIAGNOSIS — I341 Nonrheumatic mitral (valve) prolapse: Secondary | ICD-10-CM | POA: Insufficient documentation

## 2024-03-14 DIAGNOSIS — K219 Gastro-esophageal reflux disease without esophagitis: Secondary | ICD-10-CM | POA: Insufficient documentation

## 2024-03-14 HISTORY — DX: Nonrheumatic mitral (valve) insufficiency: I34.0

## 2024-03-14 HISTORY — PX: HEMOSTASIS CLIP PLACEMENT: SHX6857

## 2024-03-14 HISTORY — PX: COLONOSCOPY WITH PROPOFOL: SHX5780

## 2024-03-14 HISTORY — PX: POLYPECTOMY: SHX149

## 2024-03-14 HISTORY — DX: Mixed hyperlipidemia: E78.2

## 2024-03-14 HISTORY — DX: Prediabetes: R73.03

## 2024-03-14 SURGERY — COLONOSCOPY WITH PROPOFOL
Anesthesia: General

## 2024-03-14 MED ORDER — SODIUM CHLORIDE 0.9 % IV SOLN: INTRAVENOUS | Status: DC

## 2024-03-14 MED ORDER — PROPOFOL 500 MG/50ML IV EMUL
INTRAVENOUS | Status: DC | PRN
  Administered 2024-03-14: 130 ug/kg/min via INTRAVENOUS
  Administered 2024-03-14: 50 mg via INTRAVENOUS

## 2024-03-14 NOTE — H&P (Signed)
 Outpatient short stay form Pre-procedure 03/14/2024  Regis Bill, MD  Primary Physician: Patrice Paradise, MD  Reason for visit: Screening  History of present illness:    73 y/o lady with history of headaches and HLD here for screening colonoscopy. Father with colon cancer when he was younger than 47. No blood thinners. No significant abdominal surgeries.    Current Facility-Administered Medications:    0.9 %  sodium chloride infusion, , Intravenous, Continuous, Elyas Villamor, Rossie Muskrat, MD, Last Rate: 20 mL/hr at 03/14/24 0907, Continued from Pre-op at 03/14/24 0907  Medications Prior to Admission  Medication Sig Dispense Refill Last Dose/Taking   albuterol (VENTOLIN HFA) 108 (90 Base) MCG/ACT inhaler Inhale 2 puffs into the lungs every 6 (six) hours as needed for wheezing (or cough). 18 g 0 Past Week   ALPRAZolam (XANAX) 0.25 MG tablet Take 0.25 mg by mouth 3 (three) times daily as needed for anxiety.   Past Week   aspirin EC 81 MG tablet Take 81 mg by mouth daily.   Past Week   calcium carbonate (OSCAL) 1500 (600 Ca) MG TABS tablet Take 600 mg of elemental calcium by mouth daily. Plus Vit D3   Past Week   cyclobenzaprine (FLEXERIL) 5 MG tablet Take 5 mg by mouth 2 (two) times daily as needed for muscle spasms.   Past Week   denosumab (PROLIA) 60 MG/ML SOSY injection Inject into the skin.   Past Week   gabapentin (NEURONTIN) 100 MG capsule Take 100 mg by mouth 3 (three) times daily.   Past Week   Galcanezumab-gnlm 120 MG/ML SOAJ Inject 120 mg into the skin every 30 (thirty) days.   Past Week   guaiFENesin-codeine 100-10 MG/5ML syrup Take 5 mLs by mouth every 6 (six) hours as needed for cough. 120 mL 0 Past Week   HYDROcodone-acetaminophen (NORCO/VICODIN) 5-325 MG tablet Take 1 tablet by mouth every 6 (six) hours as needed for moderate pain.   Past Week   ipratropium (ATROVENT) 0.06 % nasal spray Place 2 sprays into both nostrils 4 (four) times daily. 15 mL 12 Past Week    meclizine (ANTIVERT) 25 MG tablet Take 25 mg by mouth 3 (three) times daily as needed for dizziness.   Past Week   Multiple Vitamin (MULTIVITAMIN) tablet Take 1 tablet by mouth daily.   Past Week   naproxen sodium (ALEVE) 220 MG tablet Take 220 mg by mouth 2 (two) times daily with a meal.   Past Week   ondansetron (ZOFRAN-ODT) 4 MG disintegrating tablet Take 1 tablet (4 mg total) by mouth every 8 (eight) hours as needed for nausea or vomiting. 30 tablet 0 Past Week   pantoprazole (PROTONIX) 40 MG tablet Take 40 mg by mouth daily.   03/13/2024   promethazine (PHENERGAN) 25 MG tablet Take 25 mg by mouth every 8 (eight) hours as needed for nausea or vomiting.    Past Week   Riboflavin (VITAMIN B-2 PO) Take 1 tablet by mouth daily.   Past Week   SUMAtriptan (IMITREX) 100 MG tablet Take 100 mg by mouth every 2 (two) hours as needed for migraine. May repeat in 2 hours if headache persists or recurs.   Past Week   topiramate (TOPAMAX) 50 MG tablet Take 50 mg by mouth daily.   Past Week   vitamin E 180 MG (400 UNITS) capsule Take 400 Units by mouth daily.   Past Week   vitamin E 45 MG (100 UNITS) capsule Take by mouth daily.  Past Week   benzonatate (TESSALON PERLES) 100 MG capsule Take 1 capsule (100 mg total) by mouth every 8 (eight) hours as needed for cough. (Patient not taking: Reported on 01/16/2022) 21 capsule 0    linaclotide (LINZESS) 72 MCG capsule Take by mouth. (Patient not taking: Reported on 01/16/2022)      metoprolol succinate (TOPROL-XL) 25 MG 24 hr tablet Take by mouth.        Allergies  Allergen Reactions   Biaxin [Clarithromycin] Nausea Only and Other (See Comments)    Stomach problems   Fosamax [Alendronate Sodium] Nausea Only and Other (See Comments)    Stomach problems   Macrobid [Nitrofurantoin Macrocrystal] Nausea Only and Other (See Comments)    Stomach problems   Prednisone Nausea Only    Makes pt nervous, stomach problems   Sulfamethoxazole-Trimethoprim Nausea Only and  Other (See Comments)    Stomach problems   Venlafaxine Nausea Only and Other (See Comments)    Stomach problems     Past Medical History:  Diagnosis Date   Bladder infection, chronic    GERD (gastroesophageal reflux disease)    Headache    Migraine    Mitral valve disorder    Mixed hyperlipidemia    Nonrheumatic mitral valve regurgitation    Pre-diabetes    Sciatica    Sciatica    Senile osteoporosis     Review of systems:  Otherwise negative.    Physical Exam  Gen: Alert, oriented. Appears stated age.  HEENT: PERRLA. Lungs: No respiratory distress CV: RRR Abd: soft, benign, no masses Ext: No edema    Planned procedures: Proceed with colonoscopy. The patient understands the nature of the planned procedure, indications, risks, alternatives and potential complications including but not limited to bleeding, infection, perforation, damage to internal organs and possible oversedation/side effects from anesthesia. The patient agrees and gives consent to proceed.  Please refer to procedure notes for findings, recommendations and patient disposition/instructions.     Shane Darling, MD [facility]

## 2024-03-14 NOTE — Transfer of Care (Signed)
 Immediate Anesthesia Transfer of Care Note  Patient: Brittney Campos  Procedure(s) Performed: COLONOSCOPY WITH PROPOFOL POLYPECTOMY, INTESTINE CONTROL OF HEMORRHAGE, GI TRACT, ENDOSCOPIC, BY CLIPPING OR OVERSEWING  Patient Location: PACU  Anesthesia Type:General  Level of Consciousness: awake  Airway & Oxygen Therapy: Patient Spontanous Breathing  Post-op Assessment: Report given to RN and Post -op Vital signs reviewed and stable  Post vital signs: Reviewed and stable  Last Vitals:  Vitals Value Taken Time  BP 128/75 03/14/24 0944  Temp 36.1 C 03/14/24 0943  Pulse 74 03/14/24 0944  Resp 11 03/14/24 0944  SpO2 99 % 03/14/24 0944  Vitals shown include unfiled device data.  Last Pain:  Vitals:   03/14/24 0943  TempSrc: Temporal  PainSc: Asleep         Complications: There were no known notable events for this encounter.

## 2024-03-14 NOTE — Interval H&P Note (Signed)
 History and Physical Interval Note:  03/14/2024 9:12 AM  Brittney Campos  has presented today for surgery, with the diagnosis of Family history of colon cancer (Z80.0).  The various methods of treatment have been discussed with the patient and family. After consideration of risks, benefits and other options for treatment, the patient has consented to  Procedure(s): COLONOSCOPY WITH PROPOFOL (N/A) as a surgical intervention.  The patient's history has been reviewed, patient examined, no change in status, stable for surgery.  I have reviewed the patient's chart and labs.  Questions were answered to the patient's satisfaction.     Shane Darling  Ok to proceed with colonoscopy

## 2024-03-14 NOTE — Anesthesia Postprocedure Evaluation (Signed)
 Anesthesia Post Note  Patient: Brittney Campos  Procedure(s) Performed: COLONOSCOPY WITH PROPOFOL POLYPECTOMY, INTESTINE CONTROL OF HEMORRHAGE, GI TRACT, ENDOSCOPIC, BY CLIPPING OR OVERSEWING  Patient location during evaluation: PACU Anesthesia Type: General Level of consciousness: awake and alert, oriented and patient cooperative Pain management: pain level controlled Vital Signs Assessment: post-procedure vital signs reviewed and stable Respiratory status: spontaneous breathing, nonlabored ventilation and respiratory function stable Cardiovascular status: blood pressure returned to baseline and stable Postop Assessment: adequate PO intake Anesthetic complications: no   There were no known notable events for this encounter.   Last Vitals:  Vitals:   03/14/24 0953 03/14/24 1003  BP:    Pulse: 74 69  Resp:    Temp:    SpO2: 100% 100%    Last Pain:  Vitals:   03/14/24 1003  TempSrc:   PainSc: 0-No pain                 Dorothey Gate

## 2024-03-14 NOTE — Anesthesia Preprocedure Evaluation (Addendum)
 Anesthesia Evaluation  Patient identified by MRN, date of birth, ID band Patient awake    Reviewed: Allergy & Precautions, NPO status , Patient's Chart, lab work & pertinent test results  History of Anesthesia Complications Negative for: history of anesthetic complications  Airway Mallampati: IV   Neck ROM: Full    Dental no notable dental hx.    Pulmonary neg pulmonary ROS   Pulmonary exam normal breath sounds clear to auscultation       Cardiovascular Normal cardiovascular exam+ Valvular Problems/Murmurs MR and MVP  Rhythm:Regular Rate:Normal  Echo 11/10/21:  NORMAL LEFT VENTRICULAR SYSTOLIC FUNCTION  NORMAL RIGHT VENTRICULAR SYSTOLIC FUNCTION  NO VALVULAR STENOSIS  MILD MR, TR, PR  TRIVIAL AR  MILD MVP  EF >55%    Neuro/Psych  Headaches Vertigo     GI/Hepatic ,GERD  ,,  Endo/Other  Prediabetes   Renal/GU negative Renal ROS     Musculoskeletal   Abdominal   Peds  Hematology negative hematology ROS (+)   Anesthesia Other Findings Cardiology note 11/11/23:  73 y.o. female with Mitral valve prolapse Mild to moderate mitral regurgitation History of atypical chest discomfort with no significant CAD on cath 2019  Patient doing well from cardiac standpoint at without any anginal symptoms. Euvolemic on exam. Blood pressure and heart rate well-controlled. Continue current medical therapy with metoprolol. Continue to stay active and maintain healthy diet  Return in about 1 year (around 11/10/2024).    Reproductive/Obstetrics                             Anesthesia Physical Anesthesia Plan  ASA: 2  Anesthesia Plan: General   Post-op Pain Management:    Induction: Intravenous  PONV Risk Score and Plan: 3 and Propofol infusion, TIVA and Treatment may vary due to age or medical condition  Airway Management Planned: Natural Airway  Additional Equipment:   Intra-op Plan:    Post-operative Plan:   Informed Consent: I have reviewed the patients History and Physical, chart, labs and discussed the procedure including the risks, benefits and alternatives for the proposed anesthesia with the patient or authorized representative who has indicated his/her understanding and acceptance.       Plan Discussed with: CRNA  Anesthesia Plan Comments: (LMA/GETA backup discussed.  Patient consented for risks of anesthesia including but not limited to:  - adverse reactions to medications - damage to eyes, teeth, lips or other oral mucosa - nerve damage due to positioning  - sore throat or hoarseness - damage to heart, brain, nerves, lungs, other parts of body or loss of life  Informed patient about role of CRNA in peri- and intra-operative care.  Patient voiced understanding.)        Anesthesia Quick Evaluation

## 2024-03-14 NOTE — Op Note (Signed)
 [facility] Patient Name: Brittney Campos Procedure Date: 03/14/2024 9:01 AM MRN: [id number] Account #: [id number] Date of Birth: 1951/07/12 Admit Type: Outpatient Age: 73 Room: [facility] ENDO ROOM 1 Gender: Female Note Status: Finalized Instrument Name: Prentice Docker [id number] Procedure:             Colonoscopy Indications:           Screening in patient at increased risk: Family history                         of 1st-degree relative with colorectal cancer before                         age 68 years Providers:             Eather Colas MD, MD Referring MD:          Eather Colas MD, MD (Referring MD) Medicines:             Monitored Anesthesia Care Complications:         No immediate complications. Estimated blood loss:                         Minimal. Procedure:             Pre-Anesthesia Assessment:                        - Prior to the procedure, a History and Physical was                         performed, and patient medications and allergies were                         reviewed. The patient is competent. The risks and                         benefits of the procedure and the sedation options and                         risks were discussed with the patient. All questions                         were answered and informed consent was obtained.                         Patient identification and proposed procedure were                         verified by the physician, the nurse, the                         anesthesiologist, the anesthetist and the technician                         in the endoscopy suite. Mental Status Examination:                         alert and oriented. Airway Examination: normal  oropharyngeal airway and neck mobility. Respiratory                         Examination: clear to auscultation. CV Examination:                         normal. Prophylactic Antibiotics: The patient does not                          require prophylactic antibiotics. Prior                         Anticoagulants: The patient has taken no anticoagulant                         or antiplatelet agents. ASA Grade Assessment: II - A                         patient with mild systemic disease. After reviewing                         the risks and benefits, the patient was deemed in                         satisfactory condition to undergo the procedure. The                         anesthesia plan was to use monitored anesthesia care                         (MAC). Immediately prior to administration of                         medications, the patient was re-assessed for adequacy                         to receive sedatives. The heart rate, respiratory                         rate, oxygen saturations, blood pressure, adequacy of                         pulmonary ventilation, and response to care were                         monitored throughout the procedure. The physical                         status of the patient was re-assessed after the                         procedure.                        After obtaining informed consent, the colonoscope was                         passed under direct vision. Throughout the procedure,  the patient's blood pressure, pulse, and oxygen                         saturations were monitored continuously. The                         Colonoscope was introduced through the anus and                         advanced to the the cecum, identified by appendiceal                         orifice and ileocecal valve. The colonoscopy was                         somewhat difficult due to significant looping.                         Successful completion of the procedure was aided by                         applying abdominal pressure. The patient tolerated the                         procedure well. The quality of the bowel preparation                         was good. The  ileocecal valve, appendiceal orifice,                         and rectum were photographed. Findings:      The perianal and digital rectal examinations were normal.      A 3 mm polyp was found in the hepatic flexure. The polyp was sessile.       The polyp was removed with a cold snare. Resection and retrieval were       complete. The polyp was overlying a vein so there was some moderate       oozing after polypectomy. For hemostasis, one hemostatic clip was       successfully placed. Bleeding had stopped at the end of the procedure.      Internal hemorrhoids were found during retroflexion. The hemorrhoids       were Grade I (internal hemorrhoids that do not prolapse).      The exam was otherwise without abnormality on direct and retroflexion       views. Impression:            - One 3 mm polyp at the hepatic flexure, removed with                         a cold snare. Resected and retrieved. Clip was placed.                        - Internal hemorrhoids.                        - The examination was otherwise normal on direct and  retroflexion views. Recommendation:        - Discharge patient to home.                        - Resume previous diet.                        - Continue present medications.                        - Await pathology results.                        - Repeat colonoscopy in 5 years for surveillance.                        - Return to referring physician as previously                         scheduled. Procedure Code(s):     --- Professional ---                        [phone], Colonoscopy, flexible; with removal of                         tumor(s), polyp(s), or other lesion(s) by snare                         technique Diagnosis Code(s):     --- Professional ---                        Z80.0, Family history of malignant neoplasm of                         digestive organs                        D12.3, Benign neoplasm of transverse colon (hepatic                          flexure or splenic flexure)                        K64.0, First degree hemorrhoids CPT copyright 2022 [employer]. All rights reserved. The codes documented in this report are preliminary and upon coder review may  be revised to meet current compliance requirements. Leida Puna MD, MD 03/14/2024 9:44:40 AM Number of Addenda: 0 Note Initiated On: 03/14/2024 9:01 AM Scope Withdrawal Time: 0 hours 9 minutes 43 seconds  Total Procedure Duration: 0 hours 21 minutes 39 seconds  Estimated Blood Loss:  Estimated blood loss was minimal.      [facility]

## 2024-03-15 ENCOUNTER — Encounter: Payer: Self-pay | Admitting: Gastroenterology

## 2024-03-15 LAB — SURGICAL PATHOLOGY

## 2024-11-22 ENCOUNTER — Other Ambulatory Visit: Payer: Self-pay | Admitting: Cardiology

## 2024-11-22 DIAGNOSIS — Z8249 Family history of ischemic heart disease and other diseases of the circulatory system: Secondary | ICD-10-CM

## 2024-11-22 DIAGNOSIS — R0789 Other chest pain: Secondary | ICD-10-CM

## 2024-11-22 DIAGNOSIS — E782 Mixed hyperlipidemia: Secondary | ICD-10-CM

## 2024-11-22 DIAGNOSIS — I340 Nonrheumatic mitral (valve) insufficiency: Secondary | ICD-10-CM

## 2024-11-22 DIAGNOSIS — I341 Nonrheumatic mitral (valve) prolapse: Secondary | ICD-10-CM

## 2024-12-12 ENCOUNTER — Encounter (HOSPITAL_COMMUNITY): Payer: Self-pay

## 2024-12-13 ENCOUNTER — Telehealth (HOSPITAL_COMMUNITY): Payer: Self-pay | Admitting: *Deleted

## 2024-12-13 NOTE — Telephone Encounter (Signed)
 Reaching out to patient to offer assistance regarding upcoming cardiac imaging study; pt verbalizes understanding of appt date/time, parking situation and where to check in, pre-test NPO status and medications ordered, and verified current allergies; name and call back number provided for further questions should they arise Sid Seats RN Navigator Cardiac Imaging [facility] [phone] office [phone] cell

## 2024-12-14 ENCOUNTER — Ambulatory Visit

## 2024-12-27 ENCOUNTER — Telehealth (HOSPITAL_COMMUNITY): Payer: Self-pay | Admitting: Emergency Medicine

## 2024-12-27 NOTE — Telephone Encounter (Signed)
 Attempted to call patient regarding upcoming cardiac CT appointment. Left message on voicemail with name and callback number Rockwell Alexandria RN Navigator Cardiac Imaging [facility] [phone] Office [phone] Cell

## 2024-12-28 ENCOUNTER — Ambulatory Visit
Admission: RE | Admit: 2024-12-28 | Discharge: 2024-12-28 | Disposition: A | Discharge status: Home / Self Care | Source: Ambulatory Visit | Attending: Cardiology | Admitting: Cardiology

## 2024-12-28 ENCOUNTER — Other Ambulatory Visit: Payer: Self-pay | Admitting: Physician Assistant

## 2024-12-28 DIAGNOSIS — Z8249 Family history of ischemic heart disease and other diseases of the circulatory system: Secondary | ICD-10-CM | POA: Diagnosis present

## 2024-12-28 DIAGNOSIS — Z1231 Encounter for screening mammogram for malignant neoplasm of breast: Secondary | ICD-10-CM

## 2024-12-28 DIAGNOSIS — E782 Mixed hyperlipidemia: Secondary | ICD-10-CM | POA: Diagnosis present

## 2024-12-28 DIAGNOSIS — I340 Nonrheumatic mitral (valve) insufficiency: Secondary | ICD-10-CM | POA: Insufficient documentation

## 2024-12-28 DIAGNOSIS — R0789 Other chest pain: Secondary | ICD-10-CM | POA: Insufficient documentation

## 2024-12-28 DIAGNOSIS — I341 Nonrheumatic mitral (valve) prolapse: Secondary | ICD-10-CM | POA: Diagnosis present

## 2024-12-28 MED ORDER — IOHEXOL 350 MG/ML SOLN
100.0000 mL | Freq: Once | INTRAVENOUS | Status: AC | PRN
  Administered 2024-12-28: 100 mL via INTRAVENOUS

## 2024-12-28 MED ORDER — METOPROLOL TARTRATE 5 MG/5ML IV SOLN: 10.0000 mg | Freq: Once | INTRAVENOUS | Status: DC | PRN

## 2024-12-28 MED ORDER — NITROGLYCERIN 0.4 MG SL SUBL
0.8000 mg | SUBLINGUAL_TABLET | Freq: Once | SUBLINGUAL | Status: AC
  Administered 2024-12-28: 0.8 mg via SUBLINGUAL
  Filled 2024-12-28: qty 25

## 2024-12-28 MED ORDER — NITROGLYCERIN 0.4 MG SL SUBL
SUBLINGUAL_TABLET | SUBLINGUAL | Status: AC
  Filled 2024-12-28: qty 2

## 2024-12-28 MED ORDER — DILTIAZEM HCL 25 MG/5ML IV SOLN: 10.0000 mg | INTRAVENOUS | Status: DC | PRN

## 2024-12-28 NOTE — Progress Notes (Signed)
 Patient tolerated procedure well. W/C to lobby.  Ambulate w/o difficulty. Denies light headedness or being dizzy. Encouraged to drink extra water today and reasoning explained. Verbalized understanding. All questions answered. ABC intact. No further needs. Discharge from procedure area w/o issues.

## 2025-01-30 ENCOUNTER — Ambulatory Visit
